# Patient Record
Sex: Male | Born: 1960 | Race: White | Hispanic: No | Marital: Married | Smoking: Current every day smoker
Health system: Southern US, Community
[De-identification: ages and names within clinical notes are randomized; demographics above are authoritative.]

## PROBLEM LIST (undated history)

## (undated) DIAGNOSIS — N181 Chronic kidney disease, stage 1: Secondary | ICD-10-CM

## (undated) DIAGNOSIS — M5020 Other cervical disc displacement, unspecified cervical region: Secondary | ICD-10-CM

## (undated) DIAGNOSIS — I10 Essential (primary) hypertension: Secondary | ICD-10-CM

## (undated) DIAGNOSIS — K219 Gastro-esophageal reflux disease without esophagitis: Secondary | ICD-10-CM

## (undated) DIAGNOSIS — F17200 Nicotine dependence, unspecified, uncomplicated: Secondary | ICD-10-CM

## (undated) DIAGNOSIS — R1310 Dysphagia, unspecified: Secondary | ICD-10-CM

## (undated) DIAGNOSIS — R3911 Hesitancy of micturition: Secondary | ICD-10-CM

## (undated) DIAGNOSIS — M5126 Other intervertebral disc displacement, lumbar region: Secondary | ICD-10-CM

## (undated) DIAGNOSIS — I129 Hypertensive chronic kidney disease with stage 1 through stage 4 chronic kidney disease, or unspecified chronic kidney disease: Secondary | ICD-10-CM

## (undated) HISTORY — DX: Hesitancy of micturition: R39.11

## (undated) HISTORY — DX: Other intervertebral disc displacement, lumbar region: M51.26

## (undated) HISTORY — DX: Dysphagia, unspecified: R13.10

## (undated) HISTORY — DX: Other cervical disc displacement, unspecified cervical region: M50.20

## (undated) HISTORY — PX: HERNIA REPAIR: SHX51

## (undated) HISTORY — DX: Essential (primary) hypertension: I10

## (undated) HISTORY — DX: Nicotine dependence, unspecified, uncomplicated: F17.200

## (undated) HISTORY — DX: Hypertensive chronic kidney disease with stage 1 through stage 4 chronic kidney disease, or unspecified chronic kidney disease: I12.9

## (undated) HISTORY — DX: Chronic kidney disease, stage 1: N18.1

---

## 2008-04-26 ENCOUNTER — Ambulatory Visit: Payer: Self-pay | Admitting: General Surgery

## 2014-05-18 ENCOUNTER — Ambulatory Visit
Admit: 2014-05-18 | Disposition: A | Discharge status: Home / Self Care | Payer: Self-pay | Attending: Family Medicine | Admitting: Family Medicine

## 2014-05-18 DIAGNOSIS — R1310 Dysphagia, unspecified: Secondary | ICD-10-CM | POA: Insufficient documentation

## 2014-05-18 DIAGNOSIS — F17200 Nicotine dependence, unspecified, uncomplicated: Secondary | ICD-10-CM | POA: Insufficient documentation

## 2014-05-18 DIAGNOSIS — I10 Essential (primary) hypertension: Secondary | ICD-10-CM | POA: Insufficient documentation

## 2014-05-18 DIAGNOSIS — R3911 Hesitancy of micturition: Secondary | ICD-10-CM | POA: Insufficient documentation

## 2014-05-18 DIAGNOSIS — I129 Hypertensive chronic kidney disease with stage 1 through stage 4 chronic kidney disease, or unspecified chronic kidney disease: Secondary | ICD-10-CM | POA: Insufficient documentation

## 2014-05-18 DIAGNOSIS — N181 Chronic kidney disease, stage 1: Secondary | ICD-10-CM | POA: Insufficient documentation

## 2014-12-25 ENCOUNTER — Other Ambulatory Visit: Payer: Self-pay | Admitting: Family Medicine

## 2014-12-25 ENCOUNTER — Ambulatory Visit (INDEPENDENT_AMBULATORY_CARE_PROVIDER_SITE_OTHER): Payer: BC Managed Care – PPO | Admitting: Family Medicine

## 2014-12-25 ENCOUNTER — Encounter: Payer: Self-pay | Admitting: Family Medicine

## 2014-12-25 VITALS — BP 149/93 | HR 78 | Temp 97.6°F | Ht 69.0 in | Wt 158.0 lb

## 2014-12-25 DIAGNOSIS — W57XXXA Bitten or stung by nonvenomous insect and other nonvenomous arthropods, initial encounter: Secondary | ICD-10-CM

## 2014-12-25 DIAGNOSIS — K219 Gastro-esophageal reflux disease without esophagitis: Secondary | ICD-10-CM | POA: Diagnosis not present

## 2014-12-25 DIAGNOSIS — I129 Hypertensive chronic kidney disease with stage 1 through stage 4 chronic kidney disease, or unspecified chronic kidney disease: Secondary | ICD-10-CM | POA: Diagnosis not present

## 2014-12-25 DIAGNOSIS — R0981 Nasal congestion: Secondary | ICD-10-CM

## 2014-12-25 DIAGNOSIS — Z23 Encounter for immunization: Secondary | ICD-10-CM | POA: Diagnosis not present

## 2014-12-25 DIAGNOSIS — Z113 Encounter for screening for infections with a predominantly sexual mode of transmission: Secondary | ICD-10-CM

## 2014-12-25 DIAGNOSIS — T148 Other injury of unspecified body region: Secondary | ICD-10-CM | POA: Diagnosis not present

## 2014-12-25 MED ORDER — DEXLANSOPRAZOLE 30 MG PO CPDR: 30.0000 mg | DELAYED_RELEASE_CAPSULE | Freq: Every day | ORAL | Status: DC

## 2014-12-25 MED ORDER — PREDNISONE 50 MG PO TABS: 50.0000 mg | ORAL_TABLET | Freq: Every day | ORAL | Status: DC

## 2014-12-25 MED ORDER — LISINOPRIL 10 MG PO TABS: 10.0000 mg | ORAL_TABLET | Freq: Every day | ORAL | Status: DC

## 2014-12-25 NOTE — Patient Instructions (Addendum)
Food Choices for Gastroesophageal Reflux Disease, Adult When you have gastroesophageal reflux disease (GERD), the foods you eat and your eating habits are very important. Choosing the right foods can help ease the discomfort of GERD. WHAT GENERAL GUIDELINES DO I NEED TO FOLLOW?  Choose fruits, vegetables, whole grains, low-fat dairy products, and low-fat meat, fish, and poultry.  Limit fats such as oils, salad dressings, butter, nuts, and avocado.  Keep a food diary to identify foods that cause symptoms.  Avoid foods that cause reflux. These may be different for different people.  Eat frequent small meals instead of three large meals each day.  Eat your meals slowly, in a relaxed setting.  Limit fried foods.  Cook foods using methods other than frying.  Avoid drinking alcohol.  Avoid drinking large amounts of liquids with your meals.  Avoid bending over or lying down until 2-3 hours after eating. WHAT FOODS ARE NOT RECOMMENDED? The following are some foods and drinks that may worsen your symptoms: Vegetables Tomatoes. Tomato juice. Tomato and spaghetti sauce. Chili peppers. Onion and garlic. Horseradish. Fruits Oranges, grapefruit, and lemon (fruit and juice). Meats High-fat meats, fish, and poultry. This includes hot dogs, ribs, ham, sausage, salami, and bacon. Dairy Whole milk and chocolate milk. Sour cream. Cream. Butter. Ice cream. Cream cheese.  Beverages Coffee and tea, with or without caffeine. Carbonated beverages or energy drinks. Condiments Hot sauce. Barbecue sauce.  Sweets/Desserts Chocolate and cocoa. Donuts. Peppermint and spearmint. Fats and Oils High-fat foods, including [place] fries and potato chips. Other Vinegar. Strong spices, such as black pepper, white pepper, red pepper, cayenne, curry powder, cloves, ginger, and chili powder. The items listed above may not be a complete list of foods and beverages to avoid. Contact your dietitian for more  information.   This information is not intended to replace advice given to you by your health care provider. Make sure you discuss any questions you have with your health care provider.   Document Released: 01/12/2005 Document Revised: 02/02/2014 Document Reviewed: 11/16/2012 Elsevier Interactive Patient Education 2016 [employer]. Smoking Cessation, Tips for Success If you are ready to quit smoking, congratulations! You have chosen to help yourself be healthier. Cigarettes bring nicotine, tar, carbon monoxide, and other irritants into your body. Your lungs, heart, and blood vessels will be able to work better without these poisons. There are many different ways to quit smoking. Nicotine gum, nicotine patches, a nicotine inhaler, or nicotine nasal spray can help with physical craving. Hypnosis, support groups, and medicines help break the habit of smoking. WHAT THINGS CAN I DO TO MAKE QUITTING EASIER?  Here are some tips to help you quit for good:  Pick a date when you will quit smoking completely. Tell all of your friends and family about your plan to quit on that date.  Do not try to slowly cut down on the number of cigarettes you are smoking. Pick a quit date and quit smoking completely starting on that day.  Throw away all cigarettes.   Clean and remove all ashtrays from your home, work, and car.  On a card, write down your reasons for quitting. Carry the card with you and read it when you get the urge to smoke.  Cleanse your body of nicotine. Drink enough water and fluids to keep your urine clear or pale yellow. Do this after quitting to flush the nicotine from your body.  Learn to predict your moods. Do not let a bad situation be your excuse to have  a cigarette. Some situations in your life might tempt you into wanting a cigarette.  Never have "just one" cigarette. It leads to wanting another and another. Remind yourself of your decision to quit.  Change habits associated with  smoking. If you smoked while driving or when feeling stressed, try other activities to replace smoking. Stand up when drinking your coffee. Brush your teeth after eating. Sit in a different chair when you read the paper. Avoid alcohol while trying to quit, and try to drink fewer caffeinated beverages. Alcohol and caffeine may urge you to smoke.  Avoid foods and drinks that can trigger a desire to smoke, such as sugary or spicy foods and alcohol.  Ask people who smoke not to smoke around you.  Have something planned to do right after eating or having a cup of coffee. For example, plan to take a walk or exercise.  Try a relaxation exercise to calm you down and decrease your stress. Remember, you may be tense and nervous for the first 2 weeks after you quit, but this will pass.  Find new activities to keep your hands busy. Play with a pen, coin, or rubber band. Doodle or draw things on paper.  Brush your teeth right after eating. This will help cut down on the craving for the taste of tobacco after meals. You can also try mouthwash.   Use oral substitutes in place of cigarettes. Try using lemon drops, carrots, cinnamon sticks, or chewing gum. Keep them handy so they are available when you have the urge to smoke.  When you have the urge to smoke, try deep breathing.  Designate your home as a nonsmoking area.  If you are a heavy smoker, ask your health care provider about a prescription for nicotine chewing gum. It can ease your withdrawal from nicotine.  Reward yourself. Set aside the cigarette money you save and buy yourself something nice.  Look for support from others. Join a support group or smoking cessation program. Ask someone at home or at work to help you with your plan to quit smoking.  Always ask yourself, "Do I need this cigarette or is this just a reflex?" Tell yourself, "Today, I choose not to smoke," or "I do not want to smoke." You are reminding yourself of your decision to  quit.  Do not replace cigarette smoking with electronic cigarettes (commonly called e-cigarettes). The safety of e-cigarettes is unknown, and some may contain harmful chemicals.  If you relapse, do not give up! Plan ahead and think about what you will do the next time you get the urge to smoke. HOW WILL I FEEL WHEN I QUIT SMOKING? You may have symptoms of withdrawal because your body is used to nicotine (the addictive substance in cigarettes). You may crave cigarettes, be irritable, feel very hungry, cough often, get headaches, or have difficulty concentrating. The withdrawal symptoms are only temporary. They are strongest when you first quit but will go away within 10-14 days. When withdrawal symptoms occur, stay in control. Think about your reasons for quitting. Remind yourself that these are signs that your body is healing and getting used to being without cigarettes. Remember that withdrawal symptoms are easier to treat than the major diseases that smoking can cause.  Even after the withdrawal is over, expect periodic urges to smoke. However, these cravings are generally short lived and will go away whether you smoke or not. Do not smoke! WHAT RESOURCES ARE AVAILABLE TO HELP ME QUIT SMOKING? Your health care  provider can direct you to community resources or hospitals for support, which may include:  Group support.  Education.  Hypnosis.  Therapy.   This information is not intended to replace advice given to you by your health care provider. Make sure you discuss any questions you have with your health care provider.   Document Released: 10/11/2003 Document Revised: 02/02/2014 Document Reviewed: 06/30/2012 Elsevier Interactive Patient Education [employer].

## 2014-12-25 NOTE — Assessment & Plan Note (Signed)
In exacerbation. Will start dexilant and check in in 1 month. If still really bad, will likely need EGD. Information about GERD given today.

## 2014-12-25 NOTE — Progress Notes (Signed)
BP 149/93 mmHg  Pulse 78  Temp(Src) 97.6 F (36.4 C)  Ht 5\' 9"  (1.753 m)  Wt 158 lb (71.668 kg)  BMI 23.32 kg/m2  SpO2 99%   Subjective:    Patient ID: Daniel Mcneil, male    DOB: [birth year], 54 y.o.   MRN: [id number]  HPI: Daniel Mcneil is a 54 y.o. male  Chief Complaint  Patient presents with  . Hypertension   HYPERTENSION- ran out of medicine the day before yesterday Hypertension status: uncontrolled  Satisfied with current treatment? 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, would like to be checked, no rashes, has been tired Duration- has been sick since August, thinks that he has a sinus infection Worst symptom: Congestion, headache Fever: no Cough: yes Shortness of breath: no Wheezing: no Chest pain: yes Chest tightness: no Chest congestion: no Nasal congestion: yes Runny nose: no Post nasal drip: yes Sneezing: no Sore throat: yes Swollen glands: no Sinus pressure: no Headache: yes Face pain: no Toothache: yes Ear pain: no  Ear pressure: no  Eyes red/itching:no Eye drainage/crusting: no  Vomiting: no Rash: no Fatigue: yes Sick contacts: no Strep contacts: no  Context: stable Recurrent sinusitis: no Relief with OTC cold/cough medications: no  Treatments attempted: cold/sinus, mucinex, anti-histamine and pseudoephedrine   HEARTBURN Duration:  X 1 month Onset: sudden Nature: burning in his chest Location: in the chest Radiation: no Episode duration: week Heartburn frequency: daily Alleviatiating factors: prilosec Aggravating factors: food Status: uncontrolled  Treatments attempted:  pepto bismol, tums, priolsec Antacid use frequency: daily Dysphagia: no Odynophagia:  no Nausea: no Vomiting: no Hematemesis: no Blood in stool: no Constitutional: no   Relevant past medical, surgical, family and social history reviewed and updated as indicated. Interim medical history since our last visit reviewed. Allergies and medications reviewed and updated.  Review of Systems  Constitutional: Negative.   HENT: Positive for congestion, hearing loss, postnasal drip, rhinorrhea, sinus pressure and voice change. Negative for dental problem, drooling, ear discharge, ear pain, facial swelling, mouth sores, nosebleeds, sneezing, sore throat, tinnitus and trouble swallowing.   Respiratory: Negative.   Cardiovascular: Negative.   Musculoskeletal: Positive for back pain and arthralgias. Negative for myalgias, joint swelling, gait problem, neck pain and neck stiffness.  Skin: Negative.   Psychiatric/Behavioral: Negative.     Per HPI unless specifically indicated above     Objective:    BP 149/93 mmHg  Pulse 78  Temp(Src) 97.6 F (36.4 C)  Ht 5\' 9"  (1.753 m)  Wt 158 lb (71.668 kg)  BMI 23.32 kg/m2  SpO2 99%  Wt Readings from Last 3 Encounters:  12/25/14 158 lb (71.668 kg)  05/18/14 162 lb (73.483 kg)    Physical Exam  Constitutional: He is oriented to person, place, and time. He appears well-developed and well-nourished. No distress.  HENT:  Head: Normocephalic and atraumatic.  Right Ear: Hearing and external ear normal.  Left Ear: Hearing and external ear normal.  Nose: Mucosal edema and rhinorrhea present. No nose lacerations, sinus tenderness, nasal deformity, septal deviation or nasal septal hematoma. No epistaxis.  No foreign bodies. Right sinus exhibits no maxillary sinus tenderness and no frontal sinus tenderness. Left sinus exhibits no maxillary  sinus tenderness and no frontal sinus tenderness.  Mouth/Throat: Oropharynx is clear and moist. No oropharyngeal exudate.   Eyes: Conjunctivae, EOM and lids are normal. Pupils are equal, round, and reactive to light. Right eye exhibits no discharge. Left eye exhibits no discharge. No scleral icterus.  Neck: Normal range of motion. Neck supple. No JVD present. No tracheal deviation present. No thyromegaly present.  Cardiovascular: Normal rate, regular rhythm, normal heart sounds and intact distal pulses.  Exam reveals no gallop and no friction rub.   No murmur heard. Pulmonary/Chest: Effort normal. No stridor. No respiratory distress. He has no wheezes. He has no rales. He exhibits no tenderness.  Abdominal: Soft. Bowel sounds are normal. He exhibits no distension and no mass. There is no tenderness. There is no rebound and no guarding.  Musculoskeletal: Normal range of motion.  Lymphadenopathy:    He has no cervical adenopathy.  Neurological: He is alert and oriented to person, place, and time.  Skin: Skin is warm, dry and intact. No rash noted. He is not diaphoretic. No erythema. No pallor.  Psychiatric: He has a normal mood and affect. His speech is normal and behavior is normal. Judgment and thought content normal. Cognition and memory are normal.  Nursing note and vitals reviewed.   No results found for this or any previous visit.    Assessment & Plan:   Problem List Items Addressed This Visit      Digestive   GERD (gastroesophageal reflux disease)    In exacerbation. Will start dexilant and check in in 1 month. If still really bad, will likely need EGD. Information about GERD given today.       Relevant Medications   Dexlansoprazole 30 MG capsule     Genitourinary   Hypertensive nephropathy    Will recheck BMP today. Microalbumin done in March. Continue current regimen. Continue to monitor. Recheck BP at follow up in 1 month.        Other Visit Diagnoses    Immunization due    -  Primary    Flu shot given today.     Relevant Orders    Flu Vaccine QUAD 36+ mos PF IM (Fluarix & Fluzone Quad PF)  (Completed)    Tick bite        Will screen for tick bourne diseases given arthralgias and fatigue and known tick bites. Await results.     Relevant Orders    Rocky mtn spotted fvr abs pnl(IgG+IgM)    Lyme Ab/Western Blot Reflex    Babesia microti Antibody Panel    Ehrlichia Antibody Panel    Routine screening for STI (sexually transmitted infection)        Will check HIV and Hep C. Await results.     Relevant Orders    Hepatitis C Antibody    HIV antibody    Nasal congestion        Will treat with prednisone x5 days followed by nasal saline. Call if not getting better or getting worse.         Follow up plan: Return in about 4 weeks (around 01/22/2015) for Follow up GERD and congestion.

## 2014-12-25 NOTE — Assessment & Plan Note (Signed)
Will recheck BMP today. Microalbumin done in March. Continue current regimen. Continue to monitor. Recheck BP at follow up in 1 month.

## 2014-12-26 ENCOUNTER — Encounter: Payer: Self-pay | Admitting: Family Medicine

## 2014-12-26 LAB — LYME AB/WESTERN BLOT REFLEX
LYME DISEASE AB, QUANT, IGM: <0.8 index (ref 0.00–0.79)
Lyme IgG/IgM Ab: <0.91 {ISR} (ref 0.00–0.90)

## 2014-12-26 LAB — BASIC METABOLIC PANEL
BUN/Creatinine Ratio: 21 — ABNORMAL HIGH (ref 9–20)
BUN: 18 mg/dL (ref 6–24)
CO2: 22 mmol/L (ref 18–29)
Calcium: 9.4 mg/dL (ref 8.7–10.2)
Chloride: 104 mmol/L (ref 97–106)
Creatinine, Ser: 0.85 mg/dL (ref 0.76–1.27)
GFR, EST AFRICAN AMERICAN: 117 mL/min/{1.73_m2} (ref >59)
GFR, EST NON AFRICAN AMERICAN: 101 mL/min/{1.73_m2} (ref >59)
Glucose: 60 mg/dL — ABNORMAL LOW (ref 65–99)
POTASSIUM: 4.3 mmol/L (ref 3.5–5.2)
SODIUM: 142 mmol/L (ref 136–144)

## 2014-12-26 LAB — EHRLICHIA ANTIBODY PANEL
E. Chaffeensis (HME) IgM Titer: NEGATIVE
E.Chaffeensis (HME) IgG: NEGATIVE
HGE IGM TITER: NEGATIVE
HGE IgG Titer: NEGATIVE

## 2014-12-26 LAB — HEPATITIS C ANTIBODY

## 2014-12-26 LAB — HIV ANTIBODY (ROUTINE TESTING W REFLEX): HIV Screen 4th Generation wRfx: NONREACTIVE

## 2014-12-28 ENCOUNTER — Telehealth: Payer: Self-pay | Admitting: Family Medicine

## 2014-12-28 MED ORDER — DOXYCYCLINE HYCLATE 100 MG PO TABS: 100.0000 mg | ORAL_TABLET | Freq: Two times a day (BID) | ORAL | Status: DC

## 2014-12-28 NOTE — Telephone Encounter (Signed)
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 I'm sending through an antibiotic for him. He may feel worse on it for the first 24-48 hours, but then he should feel better. He should absolutely take it with food. Everything else looks nice and normal.

## 2014-12-28 NOTE — Telephone Encounter (Signed)
Called and LVM for patient to return my call. °

## 2014-12-29 LAB — ROCKY MTN SPOTTED FVR ABS PNL(IGG+IGM): RMSF IgG: UNDETERMINED

## 2014-12-29 LAB — BABESIA MICROTI ANTIBODY PANEL: Babesia microti IgG: 1:40 {titer} — ABNORMAL HIGH

## 2014-12-29 LAB — RMSF, IGG, IFA

## 2014-12-31 NOTE — Telephone Encounter (Signed)
Pt would like a call back

## 2014-12-31 NOTE — Telephone Encounter (Signed)
Returned patients call, left a voicemail for patient to return my call.

## 2015-01-22 ENCOUNTER — Ambulatory Visit (INDEPENDENT_AMBULATORY_CARE_PROVIDER_SITE_OTHER): Payer: BC Managed Care – PPO | Admitting: Family Medicine

## 2015-01-22 ENCOUNTER — Encounter: Payer: Self-pay | Admitting: Family Medicine

## 2015-01-22 VITALS — BP 128/86 | HR 73 | Temp 97.9°F | Ht 68.7 in | Wt 160.0 lb

## 2015-01-22 DIAGNOSIS — R1310 Dysphagia, unspecified: Secondary | ICD-10-CM

## 2015-01-22 DIAGNOSIS — R0981 Nasal congestion: Secondary | ICD-10-CM

## 2015-01-22 DIAGNOSIS — K219 Gastro-esophageal reflux disease without esophagitis: Secondary | ICD-10-CM

## 2015-01-22 MED ORDER — OMEPRAZOLE 40 MG PO CPDR: 40.0000 mg | DELAYED_RELEASE_CAPSULE | Freq: Every day | ORAL | Status: DC

## 2015-01-22 NOTE — Progress Notes (Signed)
BP 128/86 mmHg  Pulse 73  Temp(Src) 97.9 F (36.6 C)  Ht 5' 8.7" (1.745 m)  Wt 160 lb (72.576 kg)  BMI 23.83 kg/m2  SpO2 98%   Subjective:    Patient ID: Daniel Mcneil, male    DOB: [birth year], 54 y.o.   MRN: [id number]  HPI: Daniel Mcneil is a 54 y.o. male  Chief Complaint  Patient presents with  . Gastroesophageal Reflux    Patient states that his insurance would not cover the prescription, so he got Prilosec OTC, he states that it is not helping, but he is not taking it daily  . Nasal Congestion   GERD GERD control status: better occasionally Satisfied with current treatment? no Heartburn frequency: daily  Medication side effects: no  Medication compliance: stable Previous GERD medications: omeprazole 20mg  Antacid use frequency:  daily Duration: 1 minute Nature: burning, feeling like things are getting stuck Location: in the chest Heartburn duration: 1 minute Alleviatiating factors:  Omeprazole occasionally, not always taking it.  Aggravating factors: eating Dysphagia: yes Odynophagia:  no Hematemesis: no Blood in stool: no EGD: no  Had a little congestion on Christmas night, but all the swelling has gone away. 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 Going to see his periodontitis to watch the biopsy. He did very well with the prednisone, continues with the soreness in his neck and jaw.   Relevant past medical, surgical, family and social history reviewed and updated as indicated. Interim medical history since our last visit reviewed. Allergies and medications reviewed and updated.  Review of Systems  Constitutional: Negative.   HENT: Positive for congestion and dental problem. Negative for drooling, ear discharge, ear pain, facial swelling, hearing loss, mouth sores, nosebleeds, postnasal drip, rhinorrhea, sinus pressure, sneezing, sore throat, tinnitus, trouble swallowing and  voice change.   Respiratory: Negative.   Cardiovascular: Negative.   Gastrointestinal: Negative for nausea, vomiting, abdominal pain, diarrhea, constipation, blood in stool, abdominal distention, anal bleeding and rectal pain.  Psychiatric/Behavioral: Negative.     Per HPI unless specifically indicated above     Objective:    BP 128/86 mmHg  Pulse 73  Temp(Src) 97.9 F (36.6 C)  Ht 5' 8.7" (1.745 m)  Wt 160 lb (72.576 kg)  BMI 23.83 kg/m2  SpO2 98%  Wt Readings from Last 3 Encounters:  01/22/15 160 lb (72.576 kg)  12/25/14 158 lb (71.668 kg)  05/18/14 162 lb (73.483 kg)    Physical Exam  Constitutional: He is oriented to person, place, and time. He appears well-developed and well-nourished. No distress.  HENT:  Head: Normocephalic and atraumatic.  Right Ear: Hearing normal.  Left Ear: Hearing normal.  Nose: Nose normal.  Eyes: Conjunctivae and lids are normal. Right eye exhibits no discharge. Left eye exhibits no discharge. No scleral icterus.  Cardiovascular: Normal rate, regular rhythm, normal heart sounds and intact distal pulses.  Exam reveals no gallop and no friction rub.   No murmur heard. Pulmonary/Chest: Effort normal and breath sounds normal. No respiratory distress. He has no wheezes. He has no rales. He exhibits no tenderness.  Abdominal: Soft. Bowel sounds are normal. He exhibits no distension and no mass. There is no tenderness. There is no rebound and no guarding.  Musculoskeletal: Normal range of motion.  Neurological: He is alert and oriented to person, place, and time.  Skin: Skin is warm, dry and intact. No rash noted. No erythema. No  pallor.  Psychiatric: He has a normal mood and affect. His speech is normal and behavior is normal. Judgment and thought content normal. Cognition and memory are normal.  Nursing note and vitals reviewed.   Results for orders placed or performed in visit on 0000000  Basic metabolic panel  Result Value Ref Range    Glucose 60 (L) 65 - 99 mg/dL   BUN 18 6 - 24 mg/dL   Creatinine, Ser 0.85 0.76 - 1.27 mg/dL   GFR calc non Af Amer 101 >59 mL/min/1.73   GFR calc Af Amer 117 >59 mL/min/1.73   BUN/Creatinine Ratio 21 (H) 9 - 20   Sodium 142 136 - 144 mmol/L   Potassium 4.3 3.5 - 5.2 mmol/L   Chloride 104 97 - 106 mmol/L   CO2 22 18 - 29 mmol/L   Calcium 9.4 8.7 - 10.2 mg/dL  Rocky mtn spotted fvr abs pnl(IgG+IgM)  Result Value Ref Range   RMSF IgG Equivocal Negative  Lyme Ab/Western Blot Reflex  Result Value Ref Range   Lyme IgG/IgM Ab <0.91 0.00 - 0.90 ISR   LYME DISEASE AB, QUANT, IGM <0.80 0.00 - 0.79 index  Babesia microti Antibody Panel  Result Value Ref Range   Babesia microti IgG 1:40 (H) [id number]  Ehrlichia Antibody Panel  Result Value Ref Range   E.Chaffeensis (HME) IgG Negative Neg:<1:64   E. Chaffeensis ([facility]ME) IgM Titer Negative Neg:<1:20   HGE IgG Titer Negative Neg:<1:64   HGE IgM Titer Negative Neg:<1:20  Hepatitis C Antibody  Result Value Ref Range   Hep C Virus Ab CANCELED s/co ratio  HIV antibody  Result Value Ref Range   HIV Screen 4th Generation wRfx CANCELED   HIV antibody  Result Value Ref Range   HIV Screen 4th Generation wRfx Non Reactive Non Reactive  Hepatitis C antibody  Result Value Ref Range   Hep C Virus Ab <0.1 0.0 - 0.9 s/co ratio  RMSF, IgG, IFA  Result Value Ref Range   RMSF, IGG, IFA 1:128 (H) Neg <1:64      Assessment & Plan:   Problem List Items Addressed This Visit      Digestive   Dysphagia    See above.       GERD (gastroesophageal reflux disease) - Primary    Will start omeprazole 40mg  daily for 2 weeks, if still having trouble swallowing at that point, will refer to GI for EGD. Continue to monitor closely.       Relevant Medications   omeprazole (PRILOSEC OTC) 20 MG tablet   omeprazole (PRILOSEC) 40 MG capsule    Other Visit Diagnoses    Nasal congestion        Improved. Still having some issues with his mouth. Follow up with  periodontist as needed.         Follow up plan: Return if symptoms worsen or fail to improve.

## 2015-01-22 NOTE — Assessment & Plan Note (Signed)
Will start omeprazole 40mg  daily for 2 weeks, if still having trouble swallowing at that point, will refer to GI for EGD. Continue to monitor closely.

## 2015-01-22 NOTE — Assessment & Plan Note (Signed)
See above

## 2015-02-06 ENCOUNTER — Telehealth: Payer: Self-pay | Admitting: Family Medicine

## 2015-02-06 NOTE — Telephone Encounter (Signed)
-----   Message from Valerie Roys, DO sent at 01/22/2015 10:01 AM EST ----- Check in on dyphagia, if still acting up, GI referral

## 2015-02-06 NOTE — Telephone Encounter (Signed)
Called to check in on Daniel Mcneil's dysphagia. LMOM. He is to call back.

## 2015-02-06 NOTE — Telephone Encounter (Signed)
Called and LMOM to check in on his dysphagia.

## 2015-02-07 NOTE — Telephone Encounter (Signed)
Notes that his swallowing is much better, no problem swallowing. Does not need to see GI at this time. Continue to monitor. Call if not getting better or getting worse again.

## 2015-06-18 ENCOUNTER — Ambulatory Visit: Payer: BC Managed Care – PPO | Admitting: Family Medicine

## 2015-06-20 ENCOUNTER — Encounter: Payer: Self-pay | Admitting: Family Medicine

## 2015-06-20 ENCOUNTER — Ambulatory Visit (INDEPENDENT_AMBULATORY_CARE_PROVIDER_SITE_OTHER): Payer: BC Managed Care – PPO | Admitting: Family Medicine

## 2015-06-20 VITALS — BP 144/97 | HR 78 | Temp 98.7°F | Ht 69.7 in | Wt 159.8 lb

## 2015-06-20 DIAGNOSIS — I129 Hypertensive chronic kidney disease with stage 1 through stage 4 chronic kidney disease, or unspecified chronic kidney disease: Secondary | ICD-10-CM

## 2015-06-20 DIAGNOSIS — Z13220 Encounter for screening for lipoid disorders: Secondary | ICD-10-CM | POA: Diagnosis not present

## 2015-06-20 DIAGNOSIS — Z0000 Encounter for general adult medical examination without abnormal findings: Secondary | ICD-10-CM | POA: Diagnosis not present

## 2015-06-20 DIAGNOSIS — Z125 Encounter for screening for malignant neoplasm of prostate: Secondary | ICD-10-CM | POA: Diagnosis not present

## 2015-06-20 DIAGNOSIS — W57XXXA Bitten or stung by nonvenomous insect and other nonvenomous arthropods, initial encounter: Secondary | ICD-10-CM | POA: Diagnosis not present

## 2015-06-20 DIAGNOSIS — T148 Other injury of unspecified body region: Secondary | ICD-10-CM | POA: Diagnosis not present

## 2015-06-20 LAB — MICROALBUMIN, URINE WAIVED
CREATININE, URINE WAIVED: 100 mg/dL (ref 10–300)
MICROALB, UR WAIVED: 80 mg/L — AB (ref 0–19)

## 2015-06-20 LAB — MICROSCOPIC EXAMINATION: WBC UA: NONE SEEN /HPF (ref 0–?)

## 2015-06-20 LAB — UA/M W/RFLX CULTURE, ROUTINE
BILIRUBIN UA: NEGATIVE
GLUCOSE, UA: NEGATIVE
KETONES UA: NEGATIVE
LEUKOCYTES UA: NEGATIVE
Nitrite, UA: NEGATIVE
PH UA: 5 (ref 5.0–7.5)
SPEC GRAV UA: 1.02 (ref 1.005–1.030)
UUROB: 0.2 mg/dL (ref 0.2–1.0)

## 2015-06-20 MED ORDER — LISINOPRIL 10 MG PO TABS: 10.0000 mg | ORAL_TABLET | Freq: Every day | ORAL | Status: DC

## 2015-06-20 MED ORDER — DOXYCYCLINE HYCLATE 100 MG PO TABS: 100.0000 mg | ORAL_TABLET | Freq: Two times a day (BID) | ORAL | Status: DC

## 2015-06-20 NOTE — Assessment & Plan Note (Signed)
Refill of his medicine given today. Continue to monitor. Call with any concerns. Labs checked today.

## 2015-06-20 NOTE — Progress Notes (Signed)
BP 144/97 mmHg  Pulse 78  Temp(Src) 98.7 F (37.1 C)  Ht 5' 9.7" (1.77 m)  Wt 159 lb 12.8 oz (72.485 kg)  BMI 23.14 kg/m2  SpO2 98%   Subjective:    Patient ID: Daniel Mcneil, male    DOB: 02-Apr-1960, 55 y.o.   MRN: [id number]  HPI: [name] is a 55 y.o. male  Chief Complaint  Patient presents with  . Insect Bite    pt states he was bitten by a tick on his left hip on Monday, states he has been bitten 8 to 10 times this year already  . Medication Refill    pt states he needs a refill on lisinopril, states he has an appointment for his BP next week but needs medication to get to that appointment    Got bit by numerous ticks since the spring. Had one last week on his hip that left a mark with a large red ring around it that has now resolved. He had a bad headache, and has been feeling achy and not entirely like himself. Concerned about tick diseases. No fevers. No chills. No other concerns at this time.   Relevant past medical, surgical, family and social history reviewed and updated as indicated. Interim medical history since our last visit reviewed. Allergies and medications reviewed and updated.  Review of Systems  Constitutional: Positive for fatigue. Negative for fever, chills, diaphoresis, activity change, appetite change and unexpected weight change.  Respiratory: Negative.   Cardiovascular: Negative.   Musculoskeletal: Positive for myalgias and arthralgias. Negative for back pain, joint swelling, gait problem, neck pain and neck stiffness.  Skin: Positive for wound. Negative for color change, pallor and rash.  Neurological: Positive for headaches.  Psychiatric/Behavioral: Negative.     Per HPI unless specifically indicated above     Objective:    BP 144/97 mmHg  Pulse 78  Temp(Src) 98.7 F (37.1 C)  Ht 5' 9.7" (1.77 m)  Wt 159 lb 12.8 oz (72.485 kg)  BMI 23.14 kg/m2  SpO2 98%  Wt Readings from Last 3 Encounters:  06/20/15 159 lb 12.8 oz (72.485 kg)   01/22/15 160 lb (72.576 kg)  12/25/14 158 lb (71.668 kg)    Physical Exam  Constitutional: He is oriented to person, place, and time. He appears well-developed and well-nourished. No distress.  HENT:  Head: Normocephalic and atraumatic.  Right Ear: Hearing normal.  Left Ear: Hearing normal.  Nose: Nose normal.  Eyes: Conjunctivae and lids are normal. Right eye exhibits no discharge. Left eye exhibits no discharge. No scleral icterus.  Cardiovascular: Normal rate, regular rhythm, normal heart sounds and intact distal pulses.  Exam reveals no gallop and no friction rub.   No murmur heard. Pulmonary/Chest: Effort normal and breath sounds normal. No respiratory distress. He has no wheezes. He has no rales. He exhibits no tenderness.  Musculoskeletal: Normal range of motion.  Neurological: He is alert and oriented to person, place, and time.  Skin: Skin is warm, dry and intact. No rash noted. No erythema. No pallor.  Scab on L hip where tick was attached  Psychiatric: He has a normal mood and affect. His speech is normal and behavior is normal. Judgment and thought content normal. Cognition and memory are normal.  Nursing note and vitals reviewed.   Results for orders placed or performed in visit on 0000000  Basic metabolic panel  Result Value Ref Range   Glucose 60 (L) 65 - 99 mg/dL   BUN 18 6 -  24 mg/dL   Creatinine, Ser 0.85 0.76 - 1.27 mg/dL   GFR calc non Af Amer 101 >59 mL/min/1.73   GFR calc Af Amer 117 >59 mL/min/1.73   BUN/Creatinine Ratio 21 (H) 9 - 20   Sodium 142 136 - 144 mmol/L   Potassium 4.3 3.5 - 5.2 mmol/L   Chloride 104 97 - 106 mmol/L   CO2 22 18 - 29 mmol/L   Calcium 9.4 8.7 - 10.2 mg/dL  Rocky mtn spotted fvr abs pnl(IgG+IgM)  Result Value Ref Range   RMSF IgG Equivocal Negative  Lyme Ab/Western Blot Reflex  Result Value Ref Range   Lyme IgG/IgM Ab <0.91 0.00 - 0.90 ISR   LYME DISEASE AB, QUANT, IGM <0.80 0.00 - 0.79 index  Babesia microti Antibody Panel   Result Value Ref Range   Babesia microti IgG 1:40 (H) 123456  Ehrlichia Antibody Panel  Result Value Ref Range   E.Chaffeensis (HME) IgG Negative Neg:<1:64   E. Chaffeensis (HME) IgM Titer Negative Neg:<1:20   HGE IgG Titer Negative Neg:<1:64   HGE IgM Titer Negative Neg:<1:20  Hepatitis C Antibody  Result Value Ref Range   Hep C Virus Ab CANCELED s/co ratio  HIV antibody  Result Value Ref Range   HIV Screen 4th Generation wRfx CANCELED   HIV antibody  Result Value Ref Range   HIV Screen 4th Generation wRfx Non Reactive Non Reactive  Hepatitis C antibody  Result Value Ref Range   Hep C Virus Ab <0.1 0.0 - 0.9 s/co ratio  RMSF, IgG, IFA  Result Value Ref Range   RMSF, IGG, IFA 1:128 (H) Neg <1:64      Assessment & Plan:   Problem List Items Addressed This Visit      Genitourinary   Hypertensive nephropathy    Refill of his medicine given today. Continue to monitor. Call with any concerns. Labs checked today.      Relevant Orders   Comprehensive metabolic panel   Microalbumin, Urine Waived    Other Visit Diagnoses    Tick bite    -  Primary    Numerous. Will check for tick diseases. Described erythema migricans. Will treat with 10 days of doxy empirically. Call with any concerns.     Relevant Orders    Lyme Ab/Western Blot Reflex    Rocky mtn spotted fvr abs pnl(IgG+IgM)    Ehrlichia Antibody Panel    Babesia microti Antibody Panel    Routine general medical examination at a health care facility        Being done next week- will draw labs today to save him a stick next week.     Relevant Orders    CBC with Differential/Platelet    Comprehensive metabolic panel    PSA    TSH    UA/M w/rflx Culture, Routine    Screening for cholesterol level        Being done next week- will draw labs today to save him a stick next week.     Relevant Orders    Lipid Panel w/o Chol/HDL Ratio    Screening for prostate cancer        Being done next week- will draw labs today  to save him a stick next week.     Relevant Orders    PSA        Follow up plan: Return in about 1 week (around 06/27/2015) for As scheduled for physical.

## 2015-06-26 ENCOUNTER — Encounter: Payer: Self-pay | Admitting: Family Medicine

## 2015-06-26 LAB — LIPID PANEL W/O CHOL/HDL RATIO
Cholesterol, Total: 202 mg/dL — ABNORMAL HIGH (ref 100–199)
HDL: 36 mg/dL — AB (ref >39)
LDL Calculated: 151 mg/dL — ABNORMAL HIGH (ref 0–99)
Triglycerides: 73 mg/dL (ref 0–149)
VLDL CHOLESTEROL CAL: 15 mg/dL (ref 5–40)

## 2015-06-26 LAB — CBC WITH DIFFERENTIAL/PLATELET
BASOS ABS: 0 10*3/uL (ref 0.0–0.2)
Basos: 0 %
EOS (ABSOLUTE): 0.4 10*3/uL (ref 0.0–0.4)
Eos: 3 %
Hematocrit: 49.6 % (ref 37.5–51.0)
Hemoglobin: 16.7 g/dL (ref 12.6–17.7)
IMMATURE GRANS (ABS): 0 10*3/uL (ref 0.0–0.1)
IMMATURE GRANULOCYTES: 0 %
LYMPHS: 12 %
Lymphocytes Absolute: 1.2 10*3/uL (ref 0.7–3.1)
MCH: 30.1 pg (ref 26.6–33.0)
MCHC: 33.7 g/dL (ref 31.5–35.7)
MCV: 89 fL (ref 79–97)
Monocytes Absolute: 1.1 10*3/uL — ABNORMAL HIGH (ref 0.1–0.9)
Monocytes: 10 %
NEUTROS PCT: 75 %
Neutrophils Absolute: 7.9 10*3/uL — ABNORMAL HIGH (ref 1.4–7.0)
PLATELETS: 235 10*3/uL (ref 150–379)
RBC: 5.55 x10E6/uL (ref 4.14–5.80)
RDW: 14.4 % (ref 12.3–15.4)
WBC: 10.6 10*3/uL (ref 3.4–10.8)

## 2015-06-26 LAB — COMPREHENSIVE METABOLIC PANEL
ALT: 31 IU/L (ref 0–44)
AST: 34 IU/L (ref 0–40)
Albumin/Globulin Ratio: 1.6 (ref 1.2–2.2)
Albumin: 4.4 g/dL (ref 3.5–5.5)
Alkaline Phosphatase: 84 IU/L (ref 39–117)
BUN/Creatinine Ratio: 24 — ABNORMAL HIGH (ref 9–20)
BUN: 21 mg/dL (ref 6–24)
CALCIUM: 9.3 mg/dL (ref 8.7–10.2)
CHLORIDE: 104 mmol/L (ref 96–106)
CO2: 20 mmol/L (ref 18–29)
Creatinine, Ser: 0.86 mg/dL (ref 0.76–1.27)
GFR calc non Af Amer: 100 mL/min/{1.73_m2} (ref >59)
GFR, EST AFRICAN AMERICAN: 115 mL/min/{1.73_m2} (ref >59)
GLUCOSE: 68 mg/dL (ref 65–99)
Globulin, Total: 2.7 g/dL (ref 1.5–4.5)
Potassium: 4.3 mmol/L (ref 3.5–5.2)
Sodium: 142 mmol/L (ref 134–144)
TOTAL PROTEIN: 7.1 g/dL (ref 6.0–8.5)

## 2015-06-26 LAB — EHRLICHIA ANTIBODY PANEL
E. Chaffeensis (HME) IgM Titer: NEGATIVE
E.Chaffeensis (HME) IgG: NEGATIVE
HGE IgG Titer: NEGATIVE
HGE IgM Titer: NEGATIVE

## 2015-06-26 LAB — BABESIA MICROTI ANTIBODY PANEL

## 2015-06-26 LAB — ROCKY MTN SPOTTED FVR ABS PNL(IGG+IGM)
RMSF IgG: NEGATIVE
RMSF IgM: 0.36 index (ref 0.00–0.89)

## 2015-06-26 LAB — LYME AB/WESTERN BLOT REFLEX

## 2015-06-26 LAB — PSA: Prostate Specific Ag, Serum: 0.4 ng/mL (ref 0.0–4.0)

## 2015-06-26 LAB — TSH: TSH: 1.37 u[IU]/mL (ref 0.450–4.500)

## 2015-07-08 ENCOUNTER — Ambulatory Visit (INDEPENDENT_AMBULATORY_CARE_PROVIDER_SITE_OTHER): Payer: BC Managed Care – PPO | Admitting: Family Medicine

## 2015-07-08 ENCOUNTER — Encounter: Payer: Self-pay | Admitting: Family Medicine

## 2015-07-08 VITALS — BP 135/85 | HR 65 | Temp 97.8°F | Ht 69.5 in | Wt 160.0 lb

## 2015-07-08 DIAGNOSIS — R3911 Hesitancy of micturition: Secondary | ICD-10-CM

## 2015-07-08 DIAGNOSIS — M79606 Pain in leg, unspecified: Secondary | ICD-10-CM

## 2015-07-08 DIAGNOSIS — F17200 Nicotine dependence, unspecified, uncomplicated: Secondary | ICD-10-CM

## 2015-07-08 DIAGNOSIS — I129 Hypertensive chronic kidney disease with stage 1 through stage 4 chronic kidney disease, or unspecified chronic kidney disease: Secondary | ICD-10-CM

## 2015-07-08 DIAGNOSIS — K219 Gastro-esophageal reflux disease without esophagitis: Secondary | ICD-10-CM

## 2015-07-08 DIAGNOSIS — Z1211 Encounter for screening for malignant neoplasm of colon: Secondary | ICD-10-CM | POA: Diagnosis not present

## 2015-07-08 DIAGNOSIS — Z0000 Encounter for general adult medical examination without abnormal findings: Secondary | ICD-10-CM | POA: Diagnosis not present

## 2015-07-08 MED ORDER — OMEPRAZOLE 20 MG PO CPDR: 20.0000 mg | DELAYED_RELEASE_CAPSULE | Freq: Every day | ORAL | Status: DC

## 2015-07-08 NOTE — Assessment & Plan Note (Signed)
Does not want medicine at this time. Continue to monitor.

## 2015-07-08 NOTE — Progress Notes (Signed)
BP 135/85 mmHg  Pulse 65  Temp(Src) 97.8 F (36.6 C)  Ht 5' 9.5" (1.765 m)  Wt 160 lb (72.576 kg)  BMI 23.30 kg/m2  SpO2 98%   Subjective:    Patient ID: Daniel Mcneil, male    DOB: February 07, 1960, 55 y.o.   MRN: [id number]  HPI: Daniel Mcneil is a 55 y.o. male presenting on 07/08/2015 for comprehensive medical examination. Current medical complaints include:none  He currently lives with: wife Interim Problems from his last visit: no  Depression Screen done today and results listed below:  Depression screen PHQ 2/9 07/08/2015  Decreased Interest 1  Down, Depressed, Hopeless 1  PHQ - 2 Score 2  Altered sleeping 1  Tired, decreased energy 1  Change in appetite 0  Feeling bad or failure about yourself  1  Trouble concentrating 0  Moving slowly or fidgety/restless 1  Suicidal thoughts 0  PHQ-9 Score 6    Past Medical History:  Past Medical History  Diagnosis Date  . Herniated disc, cervical   . Lumbar herniated disc   . Hypertension   . Dysphagia   . Tobacco use disorder   . Urinary hesitancy   . Hypertensive nephropathy   . Chronic kidney disease, stage I     Surgical History:  Past Surgical History  Procedure Laterality Date  . Hernia repair      Medications:  Current Outpatient Prescriptions on File Prior to Visit  Medication Sig  . lisinopril (PRINIVIL,ZESTRIL) 10 MG tablet Take 1 tablet (10 mg total) by mouth daily.   No current facility-administered medications on file prior to visit.    Allergies:  No Known Allergies  Social History:  Social History   Social History  . Marital Status: Married    Spouse Name: [name]  . Number of Children: [name]  . Years of Education: [name]   Occupational History  . Not on file.   Social History Main Topics  . Smoking status: Current Every Day Smoker -- 1.50 packs/day    Types: Cigarettes  . Smokeless tobacco: Never Used  . Alcohol Use: 0.0 oz/week    0 Standard drinks or equivalent per week     Comment: 3 days a week   . Drug Use: No  . Sexual Activity: Yes    Birth Control/ Protection: None   Other Topics Concern  . Not on file   Social History Narrative   History  Smoking status  . Current Every Day Smoker -- 1.50 packs/day  . Types: Cigarettes  Smokeless tobacco  . Never Used   History  Alcohol Use  . 0.0 oz/week  . 0 Standard drinks or equivalent per week    Comment: 3 days a week    Family History:  Family History  Problem Relation Age of Onset  . Stroke Mother   . Hypertension Mother   . Cancer Father   . Hypertension Father   . Diabetes Father   . Heart disease Father   . Cancer Maternal Grandfather   . Hypertension Sister     Past medical history, surgical history, medications, allergies, family history and social history reviewed with patient today and changes made to appropriate areas of the chart.   Review of Systems  Constitutional: Negative.   HENT: Positive for congestion. Negative for ear discharge, ear pain, hearing loss, nosebleeds, sore throat and tinnitus.   Eyes: Negative.   Respiratory: Positive for cough. Negative for hemoptysis, sputum production, shortness of breath, wheezing and stridor.   Cardiovascular:  Negative.   Gastrointestinal: Positive for heartburn (under good control with the omeprazole). Negative for nausea, vomiting, abdominal pain, diarrhea, constipation, blood in stool and melena.  Genitourinary: Negative.   Musculoskeletal: Positive for myalgias (back of his legs very sore) and back pain (muscle spasms in his back). Negative for joint pain, falls and neck pain.  Skin: Negative.        Spot under R eye getting slightly better  Neurological: Negative.  Negative for headaches.  Endo/Heme/Allergies: Positive for environmental allergies. Negative for polydipsia. Bruises/bleeds easily.  Psychiatric/Behavioral: Negative.     All other ROS negative except what is listed above and in the HPI.      Objective:    BP 135/85 mmHg  Pulse 65   Temp(Src) 97.8 F (36.6 C)  Ht 5' 9.5" (1.765 m)  Wt 160 lb (72.576 kg)  BMI 23.30 kg/m2  SpO2 98%  Wt Readings from Last 3 Encounters:  07/08/15 160 lb (72.576 kg)  06/20/15 159 lb 12.8 oz (72.485 kg)  01/22/15 160 lb (72.576 kg)    Physical Exam  Constitutional: He is oriented to person, place, and time. He appears well-developed and well-nourished. No distress.  HENT:  Head: Normocephalic and atraumatic.  Right Ear: Hearing, tympanic membrane, external ear and ear canal normal.  Left Ear: Hearing, tympanic membrane, external ear and ear canal normal.  Nose: Nose normal.  Mouth/Throat: Uvula is midline, oropharynx is clear and moist and mucous membranes are normal. No oropharyngeal exudate.  Eyes: Conjunctivae, EOM and lids are normal. Pupils are equal, round, and reactive to light. Right eye exhibits no discharge. Left eye exhibits no discharge. No scleral icterus.  Neck: Normal range of motion. Neck supple. No JVD present. No tracheal deviation present. No thyromegaly present.  Cardiovascular: Normal rate, regular rhythm, normal heart sounds and intact distal pulses.  Exam reveals no gallop and no friction rub.   No murmur heard. Pulmonary/Chest: Effort normal. No stridor. No respiratory distress.  Abdominal: Soft. Bowel sounds are normal. He exhibits no distension and no mass. There is no tenderness. There is no rebound and no guarding. Hernia confirmed negative in the right inguinal area and confirmed negative in the left inguinal area.  Genitourinary: Rectum normal, testes normal and penis normal. Prostate is enlarged. Prostate is not tender. Cremasteric reflex is present. Right testis shows no mass, no swelling and no tenderness. Right testis is descended. Cremasteric reflex is not absent on the right side. Left testis shows no mass, no swelling and no tenderness. Left testis is descended. Cremasteric reflex is not absent on the left side. Circumcised. No penile tenderness.   Musculoskeletal: Normal range of motion. He exhibits no edema or tenderness.  Lymphadenopathy:    He has no cervical adenopathy.  Neurological: He is alert and oriented to person, place, and time. He has normal reflexes. He displays normal reflexes. No cranial nerve deficit. He exhibits normal muscle tone. Coordination normal.  Skin: Skin is warm, dry and intact. No rash noted. He is not diaphoretic. No erythema. No pallor.  Psychiatric: He has a normal mood and affect. His speech is normal and behavior is normal. Judgment and thought content normal. Cognition and memory are normal.  Nursing note and vitals reviewed.   Results for orders placed or performed in visit on 06/20/15  Microscopic Examination  Result Value Ref Range   WBC, UA None seen 0 -  5 /hpf   RBC, UA 0-2 0 -  2 /hpf   Epithelial Cells (non  renal) 0-10 0 - 10 /hpf  Lyme Ab/Western Blot Reflex  Result Value Ref Range   Lyme IgG/IgM Ab <0.91 0.00 - 0.90 ISR   LYME DISEASE AB, QUANT, IGM <0.80 0.00 - 0.79 index  Rocky mtn spotted fvr abs pnl(IgG+IgM)  Result Value Ref Range   RMSF IgG Negative Negative   RMSF IgM 0.36 0.00 - 123456 index  Ehrlichia Antibody Panel  Result Value Ref Range   E.Chaffeensis (HME) IgG Negative Neg:<1:64   E. Chaffeensis (HME) IgM Titer Negative Neg:<1:20   HGE IgG Titer Negative Neg:<1:64   HGE IgM Titer Negative Neg:<1:20  Babesia microti Antibody Panel  Result Value Ref Range   Babesia microti IgM <1:10 Neg:<1:10   Babesia microti IgG <1:10 Neg:<1:10  CBC with Differential/Platelet  Result Value Ref Range   WBC 10.6 3.4 - 10.8 x10E3/uL   RBC 5.55 4.14 - 5.80 x10E6/uL   Hemoglobin 16.7 12.6 - 17.7 g/dL   Hematocrit 49.6 37.5 - 51.0 %   MCV 89 79 - 97 fL   MCH 30.1 26.6 - 33.0 pg   MCHC 33.7 31.5 - 35.7 g/dL   RDW 14.4 12.3 - 15.4 %   Platelets 235 150 - 379 x10E3/uL   Neutrophils 75 %   Lymphs 12 %   Monocytes 10 %   Eos 3 %   Basos 0 %   Neutrophils Absolute 7.9 (H) 1.4 - 7.0  x10E3/uL   Lymphocytes Absolute 1.2 0.7 - 3.1 x10E3/uL   Monocytes Absolute 1.1 (H) 0.1 - 0.9 x10E3/uL   EOS (ABSOLUTE) 0.4 0.0 - 0.4 x10E3/uL   Basophils Absolute 0.0 0.0 - 0.2 x10E3/uL   Immature Granulocytes 0 %   Immature Grans (Abs) 0.0 0.0 - 0.1 x10E3/uL  Comprehensive metabolic panel  Result Value Ref Range   Glucose 68 65 - 99 mg/dL   BUN 21 6 - 24 mg/dL   Creatinine, Ser 0.86 0.76 - 1.27 mg/dL   GFR calc non Af Amer 100 >59 mL/min/1.73   GFR calc Af Amer 115 >59 mL/min/1.73   BUN/Creatinine Ratio 24 (H) 9 - 20   Sodium 142 134 - 144 mmol/L   Potassium 4.3 3.5 - 5.2 mmol/L   Chloride 104 96 - 106 mmol/L   CO2 20 18 - 29 mmol/L   Calcium 9.3 8.7 - 10.2 mg/dL   Total Protein 7.1 6.0 - 8.5 g/dL   Albumin 4.4 3.5 - 5.5 g/dL   Globulin, Total 2.7 1.5 - 4.5 g/dL   Albumin/Globulin Ratio 1.6 1.2 - 2.2   Bilirubin Total <0.2 0.0 - 1.2 mg/dL   Alkaline Phosphatase 84 39 - 117 IU/L   AST 34 0 - 40 IU/L   ALT 31 0 - 44 IU/L  Lipid Panel w/o Chol/HDL Ratio  Result Value Ref Range   Cholesterol, Total 202 (H) 100 - 199 mg/dL   Triglycerides 73 0 - 149 mg/dL   HDL 36 (L) >39 mg/dL   VLDL Cholesterol Cal 15 5 - 40 mg/dL   LDL Calculated 151 (H) 0 - 99 mg/dL  Microalbumin, Urine Waived  Result Value Ref Range   Microalb, Ur Waived 80 (H) 0 - 19 mg/L   Creatinine, Urine Waived 100 10 - 300 mg/dL   Microalb/Creat Ratio 30-300 (H) <30 mg/g  PSA  Result Value Ref Range   Prostate Specific Ag, Serum 0.4 0.0 - 4.0 ng/mL  TSH  Result Value Ref Range   TSH 1.370 0.450 - 4.500 uIU/mL  UA/M w/rflx Culture, Routine  Result Value Ref Range   Specific Gravity, UA 1.020 1.005 - 1.030   pH, UA 5.0 5.0 - 7.5   Color, UA Yellow Yellow   Appearance Ur Clear Clear   Leukocytes, UA Negative Negative   Protein, UA Trace Negative/Trace   Glucose, UA Negative Negative   Ketones, UA Negative Negative   RBC, UA 1+ (A) Negative   Bilirubin, UA Negative Negative   Urobilinogen, Ur 0.2 0.2 -  1.0 mg/dL   Nitrite, UA Negative Negative   Microscopic Examination See below:       Assessment & Plan:   Problem List Items Addressed This Visit      Digestive   GERD (gastroesophageal reflux disease)    Stable on current regimen. Continue to monitor.       Relevant Medications   omeprazole (PRILOSEC) 20 MG capsule     Genitourinary   Hypertensive nephropathy    Under good control. Continue current regimen. Continue to monitor.         Other   Tobacco use disorder    He knows we are here if he'd like to quit      Urinary hesitancy    Does not want medicine at this time. Continue to monitor.        Other Visit Diagnoses    Routine general medical examination at a health care facility    -  Primary    Up to date on vaccines. Will check on colonoscopy. Work on diet and exercise. Continue to monitor.     Pain of lower extremity, unspecified laterality        Will work on stretches. Call with any concerns.     Screening for colon cancer        Referral generated today.    Relevant Orders    Ambulatory referral to General Surgery        Discussed aspirin prophylaxis for myocardial infarction prevention and decision was it was not indicated  LABORATORY TESTING:  Health maintenance labs done previously as discussed above.  The natural history of prostate cancer and ongoing controversy regarding screening and potential treatment outcomes of prostate cancer has been discussed with the patient. The meaning of a false positive PSA and a false negative PSA has been discussed. He indicates understanding of the limitations of this screening test and wishes to proceed with screening PSA testing.   IMMUNIZATIONS:   - Tdap: Tetanus vaccination status reviewed: last tetanus booster within 10 years. - Influenza: Up to date - Pneumovax: up to date  SCREENING: - Colonoscopy: Ordered today  Discussed with patient purpose of the colonoscopy is to detect colon cancer at curable  precancerous or early stages   PATIENT COUNSELING:    Sexuality: Discussed sexually transmitted diseases, partner selection, use of condoms, avoidance of unintended pregnancy  and contraceptive alternatives.   Advised to avoid cigarette smoking.  I discussed with the patient that most people either abstain from alcohol or drink within safe limits (<=14/week and <=4 drinks/occasion for males, <=7/weeks and <= 3 drinks/occasion for females) and that the risk for alcohol disorders and other health effects rises proportionally with the number of drinks per week and how often a drinker exceeds daily limits.  Discussed cessation/primary prevention of drug use and availability of treatment for abuse.   Diet: Encouraged to adjust caloric intake to maintain  or achieve ideal body weight, to reduce intake of dietary saturated fat and total fat, to limit sodium intake by avoiding high sodium  foods and not adding table salt, and to maintain adequate dietary potassium and calcium preferably from fresh fruits, vegetables, and low-fat dairy products.    stressed the importance of regular exercise  Injury prevention: Discussed safety belts, safety helmets, smoke detector, smoking near bedding or upholstery.   Dental health: Discussed importance of regular tooth brushing, flossing, and dental visits.   Follow up plan: NEXT PREVENTATIVE PHYSICAL DUE IN 1 YEAR. Return in about 6 months (around 01/07/2016) for BP follow up.

## 2015-07-08 NOTE — Assessment & Plan Note (Signed)
Stable on current regimen. °Continue to monitor.  °

## 2015-07-08 NOTE — Patient Instructions (Addendum)
Health Maintenance, Male A healthy lifestyle and preventative care can promote health and wellness.  Maintain regular health, dental, and eye exams.  Eat a healthy diet. Foods like vegetables, fruits, whole grains, low-fat dairy products, and lean protein foods contain the nutrients you need and are low in calories. Decrease your intake of foods high in solid fats, added sugars, and salt. Get information about a proper diet from your health care provider, if necessary.  Regular physical exercise is one of the most important things you can do for your health. Most adults should get at least 150 minutes of moderate-intensity exercise (any activity that increases your heart rate and causes you to sweat) each week. In addition, most adults need muscle-strengthening exercises on 2 or more days a week.   Maintain a healthy weight. The body mass index (BMI) is a screening tool to identify possible weight problems. It provides an estimate of body fat based on height and weight. Your health care provider can find your BMI and can help you achieve or maintain a healthy weight. For males 20 years and older:  A BMI below 18.5 is considered underweight.  A BMI of 18.5 to 24.9 is normal.  A BMI of 25 to 29.9 is considered overweight.  A BMI of 30 and above is considered obese.  Maintain normal blood lipids and cholesterol by exercising and minimizing your intake of saturated fat. Eat a balanced diet with plenty of fruits and vegetables. Blood tests for lipids and cholesterol should begin at age 20 and be repeated every 5 years. If your lipid or cholesterol levels are high, you are over age 50, or you are at high risk for heart disease, you may need your cholesterol levels checked more frequently.Ongoing high lipid and cholesterol levels should be treated with medicines if diet and exercise are not working.  If you smoke, find out from your health care provider how to quit. If you do not use tobacco, do not  start.  Lung cancer screening is recommended for adults aged 55-80 years who are at high risk for developing lung cancer because of a history of smoking. A yearly low-dose CT scan of the lungs is recommended for people who have at least a 30-pack-year history of smoking and are current smokers or have quit within the past 15 years. A pack year of smoking is smoking an average of 1 pack of cigarettes a day for 1 year (for example, a 30-pack-year history of smoking could mean smoking 1 pack a day for 30 years or 2 packs a day for 15 years). Yearly screening should continue until the smoker has stopped smoking for at least 15 years. Yearly screening should be stopped for people who develop a health problem that would prevent them from having lung cancer treatment.  If you choose to drink alcohol, do not have more than 2 drinks per day. One drink is considered to be 12 oz (360 mL) of beer, 5 oz (150 mL) of wine, or 1.5 oz (45 mL) of liquor.  Avoid the use of street drugs. Do not share needles with anyone. Ask for help if you need support or instructions about stopping the use of drugs.  High blood pressure causes heart disease and increases the risk of stroke. High blood pressure is more likely to develop in:  People who have blood pressure in the end of the normal range (100-139/85-89 mm Hg).  People who are overweight or obese.  People who are African American.    If you are 18-39 years of age, have your blood pressure checked every 3-5 years. If you are 40 years of age or older, have your blood pressure checked every year. You should have your blood pressure measured twice--once when you are at a hospital or clinic, and once when you are not at a hospital or clinic. Record the average of the two measurements. To check your blood pressure when you are not at a hospital or clinic, you can use:  An automated blood pressure machine at a pharmacy.  A home blood pressure monitor.  If you are 45-79 years  old, ask your health care provider if you should take aspirin to prevent heart disease.  Diabetes screening involves taking a blood sample to check your fasting blood sugar level. This should be done once every 3 years after age 45 if you are at a normal weight and without risk factors for diabetes. Testing should be considered at a younger age or be carried out more frequently if you are overweight and have at least 1 risk factor for diabetes.  Colorectal cancer can be detected and often prevented. Most routine colorectal cancer screening begins at the age of 50 and continues through age 75. However, your health care provider may recommend screening at an earlier age if you have risk factors for colon cancer. On a yearly basis, your health care provider may provide home test kits to check for hidden blood in the stool. A small camera at the end of a tube may be used to directly examine the colon (sigmoidoscopy or colonoscopy) to detect the earliest forms of colorectal cancer. Talk to your health care provider about this at age 50 when routine screening begins. A direct exam of the colon should be repeated every 5-10 years through age 75, unless early forms of precancerous polyps or small growths are found.  People who are at an increased risk for hepatitis B should be screened for this virus. You are considered at high risk for hepatitis B if:  You were born in a country where hepatitis B occurs often. Talk with your health care provider about which countries are considered high risk.  Your parents were born in a high-risk country and you have not received a shot to protect against hepatitis B (hepatitis B vaccine).  You have HIV or AIDS.  You use needles to inject street drugs.  You live with, or have sex with, someone who has hepatitis B.  You are a man who has sex with other men (MSM).  You get hemodialysis treatment.  You take certain medicines for conditions like cancer, organ  transplantation, and autoimmune conditions.  Hepatitis C blood testing is recommended for all people born from 1945 through 1965 and any individual with known risk factors for hepatitis C.  Healthy men should no longer receive prostate-specific antigen (PSA) blood tests as part of routine cancer screening. Talk to your health care provider about prostate cancer screening.  Testicular cancer screening is not recommended for adolescents or adult males who have no symptoms. Screening includes self-exam, a health care provider exam, and other screening tests. Consult with your health care provider about any symptoms you have or any concerns you have about testicular cancer.  Practice safe sex. Use condoms and avoid high-risk sexual practices to reduce the spread of sexually transmitted infections (STIs).  You should be screened for STIs, including gonorrhea and chlamydia if:  You are sexually active and are younger than 24 years.  You   are older than 24 years, and your health care provider tells you that you are at risk for this type of infection.  Your sexual activity has changed since you were last screened, and you are at an increased risk for chlamydia or gonorrhea. Ask your health care provider if you are at risk.  If you are at risk of being infected with HIV, it is recommended that you take a prescription medicine daily to prevent HIV infection. This is called pre-exposure prophylaxis (PrEP). You are considered at risk if:  You are a man who has sex with other men (MSM).  You are a heterosexual man who is sexually active with multiple partners.  You take drugs by injection.  You are sexually active with a partner who has HIV.  Talk with your health care provider about whether you are at high risk of being infected with HIV. If you choose to begin PrEP, you should first be tested for HIV. You should then be tested every 3 months for as long as you are taking PrEP.  Use sunscreen. Apply  sunscreen liberally and repeatedly throughout the day. You should seek shade when your shadow is shorter than you. Protect yourself by wearing long sleeves, pants, a wide-brimmed hat, and sunglasses year round whenever you are outdoors.  Tell your health care provider of new moles or changes in moles, especially if there is a change in shape or color. Also, tell your health care provider if a mole is larger than the size of a pencil eraser.  A one-time screening for abdominal aortic aneurysm (AAA) and surgical repair of large AAAs by ultrasound is recommended for men aged 58-75 years who are current or former smokers.  Stay current with your vaccines (immunizations).   This information is not intended to replace advice given to you by your health care provider. Make sure you discuss any questions you have with your health care provider.   Document Released: 07/11/2007 Document Revised: 02/02/2014 Document Reviewed: 06/09/2010 Elsevier Interactive Patient Education 2016 [place]. Hamstring Strain With Rehab The hamstring muscle and tendons are vulnerable to muscle or tendon tear (strain). Hamstring tears cause pain and inflammation in the backside of the thigh, where the hamstring muscles are located. The hamstring is comprised of three muscles that are responsible for straightening the hip, bending the knee, and stabilizing the knee. These muscles are important for walking, running, and jumping. Hamstring strain is the most common injury of the thigh. Hamstring strains are classified as grade 1 or 2 strains. Grade 1 strains cause pain, but the tendon is not lengthened. Grade 2 strains include a lengthened ligament due to the ligament being stretched or partially ruptured. With grade 2 strains there is still function, although the function may be decreased.  SYMPTOMS   Pain, tenderness, swelling, warmth, or redness over the hamstring muscles, at the back of the thigh.  Pain that gets worse  during and after intense activity.  A "pop" heard in the area, at the time of injury.  Muscle spasm in the hamstring muscles.  Pain or weakness with running, jumping, or bending the knee against resistance.  Crackling sound (crepitation) when the tendon is moved or touched.  Bruising (contusion) in the thigh within 48 hours of injury.  Loss of fullness of the muscle, or area of muscle bulging in the case of a complete rupture. CAUSES  A muscle strain occurs when a force is placed on the muscle or tendon that is greater than it  can withstand. Common causes of injury include:  Strain from overuse or sudden increase in the frequency, duration, or intensity of activity.  Single violent blow or force to the back of the knee or the hamstring area of the thigh. RISK INCREASES WITH:  Sports that require quick starts (sprinting, racquetball, tennis).  Sports that require jumping (basketball, volleyball).  Kicking sports and water skiing.  Contact sports (soccer, football).  Poor strength and flexibility.  Failure to warm up properly before activity.  Previous thigh, knee, or pelvis injury.  Poor exercise technique.  Poor posture. PREVENTION  Maintain physical fitness:  Strength, flexibility, and endurance.  Cardiovascular fitness.  Learn and use proper exercise technique and posture.  Wear proper fitted and padded protective equipment. PROGNOSIS  If treated properly, hamstring strains are usually curable in 2 to 6 weeks. RELATED COMPLICATIONS   Longer healing time, if not properly treated or if not given adequate time to heal.  Chronically inflamed tendon, causing persistent pain with activity that may progress to constant pain.  Recurring symptoms, if activity is resumed too soon.  Vulnerable to repeated injury (in up to 25% of cases). TREATMENT  Treatment first involves the use of ice and medication to help reduce pain and inflammation. It is also important to  complete strengthening and stretching exercises, as well as modifying any activities that aggravate the symptoms. These exercises may be completed at home or with a therapist. Your caregiver may recommend the use of crutches to help reduce pain and discomfort, especially is the strain is severe enough to cause limping. If the tendon has pulled away from the bone, then surgery is necessary to reattach it. MEDICATION   If pain medicine is needed, nonsteroidal anti-inflammatory medicines (aspirin and ibuprofen), or other minor pain relievers (acetaminophen), are often advised.  Do not take pain medicine for 7 days before surgery.  Prescription pain relievers may be given if your caregiver thinks they are needed. Use only as directed and only as much as you need.  Corticosteroid injections may be recommended. However, these injections should only be used for serious cases, as they can only be given a certain number of times.  Ointments applied to the skin may be beneficial. HEAT AND COLD  Cold treatment (icing) relieves pain and reduces inflammation. Cold treatment should be applied for 10 to 15 minutes every 2 to 3 hours, and immediately after activity that aggravates your symptoms. Use ice packs or an ice massage.  Heat treatment may be used before performing the stretching and strengthening activities prescribed by your caregiver, physical therapist, or athletic trainer. Use a heat pack or a warm water soak. SEEK MEDICAL CARE IF:   Symptoms get worse or do not improve in 2 weeks, despite treatment.  New, unexplained symptoms develop. (Drugs used in treatment may produce side effects.) EXERCISES RANGE OF MOTION (ROM) AND STRETCHING EXERCISES - Hamstring Strain These exercises may help you when beginning to rehabilitate your injury. Your symptoms may go away with or without further involvement from your physician, physical therapist or athletic trainer. While completing these exercises,  remember:   Restoring tissue flexibility helps normal motion to return to the joints. This allows healthier, less painful movement and activity.  An effective stretch should be held for at least 30 seconds.  A stretch should never be painful. You should only feel a gentle lengthening or release in the stretched tissue. STRETCH - Hamstrings, Standing  Stand or sit, and extend your right / left leg, placing  your foot on a chair or foot stool.  Keep a slight arch in your low back and your hips straight forward.  Lead with your chest, and lean forward at the waist until you feel a gentle stretch in the back of your right / left knee or thigh. (When done correctly, this exercise requires leaning only a small distance.)  Hold this position for __________ seconds. Repeat __________ times. Complete this stretch __________ times per day. STRETCH - Hamstrings, Supine   Lie on your back. Loop a belt or towel over the ball of your right / left foot.  Straighten your right / left knee and slowly pull on the belt to raise your leg. Do not allow the right / left knee to bend. Keep your opposite leg flat on the floor.  Raise the leg until you feel a gentle stretch behind your right / left knee or thigh. Hold this position for __________ seconds. Repeat __________ times. Complete this stretch __________ times per day.  STRETCH - Hamstrings, Doorway  Lie on your back with your right / left leg extended and resting on the wall, and the opposite leg flat on the ground through the door. Initially, position your bottom farther away from the wall.  Keep your right / left knee straight. If you feel a stretch behind your knee or thigh, hold this position for __________ seconds.  If you do not feel a stretch, scoot your bottom closer to the door and hold __________ seconds. Repeat __________ times. Complete this stretch __________ times per day.  STRETCH - Hamstrings/Adductors, V-Sit   Sit on the floor with  your legs extended in a large "V," keeping your knees straight.  With your head and chest upright, bend at your waist reaching for your left foot to stretch your right thigh muscles.  You should feel a stretch in your right inner thigh. Hold for __________ seconds.  Return to the upright position to relax your leg muscles.  Continuing to keep your chest upright, bend straight forward at your waist to stretch your hamstrings.  You should feel a stretch behind both of your thighs and knees. Hold for __________ seconds.  Return to the upright position to relax your leg muscles.  With your head and chest upright, bend at your waist reaching for your right foot to stretch your left thigh muscles.  You should feel a stretch in your left inner thigh. Hold for __________ seconds.  Return to the upright position to relax your leg muscles. Repeat __________ times. Complete this exercise __________ times per day.  STRENGTHENING EXERCISES - Hamstring Strain These exercises may help you when beginning to rehabilitate your injury. They may resolve your symptoms with or without further involvement from your physician, physical therapist or athletic trainer. While completing these exercises, remember:   Muscles can gain both the endurance and the strength needed for everyday activities through controlled exercises.  Complete these exercises as instructed by your physician, physical therapist or athletic trainer. Increase the resistance and repetitions only as guided.  You may experience muscle soreness or fatigue, but the pain or discomfort you are trying to eliminate should never get worse during these exercises. If this pain does get worse, stop and make certain you are following the directions exactly. If the pain is still present after adjustments, discontinue the exercise until you can discuss the trouble with your clinician. STRENGTH - Hip Extensors, Straight Leg Raises   Lie on your stomach on a  firm surface.  Tense the muscles in your buttocks to lift your right / left leg about 4 inches. If you cannot lift your leg this high without arching your back, place a pillow under your hips.  Keep your knee straight. Hold for __________ seconds.  Slowly lower your leg to the starting position and allow it to relax completely before starting the next repetition. Repeat __________ times. Complete this exercise __________ times per day.  STRENGTH - Hamstring, Isometrics   Lie on your back on a firm surface.  Bend your right / left knee approximately __________ degrees.  Dig your heel into the surface, as if you are trying to pull it toward your buttocks. Tighten the muscles in the back of your thighs to "dig" as hard as you can, without increasing any pain.  Hold this position for __________ seconds.  Release the tension gradually and allow your muscles to completely relax for __________ seconds between each exercise. Repeat __________ times. Complete this exercise __________ times per day.  STRENGTH - Hamstring, Curls   Lay on your stomach with your legs extended. (If you lay on a bed, your feet may hang over the edge.)  Tighten the muscles in the back of your thigh to bend your right / left knee up to 90 degrees. Keep your hips flat on the bed or floor.  Hold this position for __________ seconds.  Slowly lower your leg back to the starting position. Repeat __________ times. Complete this exercise __________ times per day.  OPTIONAL ANKLE WEIGHTS: Begin with ____________________, but DO NOT exceed ____________________. Increase in 1 pound/0.5 kilogram increments.   This information is not intended to replace advice given to you by your health care provider. Make sure you discuss any questions you have with your health care provider.   Document Released: 01/12/2005 Document Revised: 02/02/2014 Document Reviewed: 04/26/2008 Elsevier Interactive Patient Education 2016 [employer]. Smoking Cessation, Tips for Success If you are ready to quit smoking, congratulations! You have chosen to help yourself be healthier. Cigarettes bring nicotine, tar, carbon monoxide, and other irritants into your body. Your lungs, heart, and blood vessels will be able to work better without these poisons. There are many different ways to quit smoking. Nicotine gum, nicotine patches, a nicotine inhaler, or nicotine nasal spray can help with physical craving. Hypnosis, support groups, and medicines help break the habit of smoking. WHAT THINGS CAN I DO TO MAKE QUITTING EASIER?  Here are some tips to help you quit for good:  Pick a date when you will quit smoking completely. Tell all of your friends and family about your plan to quit on that date.  Do not try to slowly cut down on the number of cigarettes you are smoking. Pick a quit date and quit smoking completely starting on that day.  Throw away all cigarettes.   Clean and remove all ashtrays from your home, work, and car.  On a card, write down your reasons for quitting. Carry the card with you and read it when you get the urge to smoke.  Cleanse your body of nicotine. Drink enough water and fluids to keep your urine clear or pale yellow. Do this after quitting to flush the nicotine from your body.  Learn to predict your moods. Do not let a bad situation be your excuse to have a cigarette. Some situations in your life might tempt you into wanting a cigarette.  Never have "just one" cigarette. It leads to wanting another and another. Remind yourself of  your decision to quit.  Change habits associated with smoking. If you smoked while driving or when feeling stressed, try other activities to replace smoking. Stand up when drinking your coffee. Brush your teeth after eating. Sit in a different chair when you read the paper. Avoid alcohol while trying to quit, and try to drink fewer caffeinated beverages. Alcohol and caffeine may urge you to  smoke.  Avoid foods and drinks that can trigger a desire to smoke, such as sugary or spicy foods and alcohol.  Ask people who smoke not to smoke around you.  Have something planned to do right after eating or having a cup of coffee. For example, plan to take a walk or exercise.  Try a relaxation exercise to calm you down and decrease your stress. Remember, you may be tense and nervous for the first 2 weeks after you quit, but this will pass.  Find new activities to keep your hands busy. Play with a pen, coin, or rubber band. Doodle or draw things on paper.  Brush your teeth right after eating. This will help cut down on the craving for the taste of tobacco after meals. You can also try mouthwash.   Use oral substitutes in place of cigarettes. Try using lemon drops, carrots, cinnamon sticks, or chewing gum. Keep them handy so they are available when you have the urge to smoke.  When you have the urge to smoke, try deep breathing.  Designate your home as a nonsmoking area.  If you are a heavy smoker, ask your health care provider about a prescription for nicotine chewing gum. It can ease your withdrawal from nicotine.  Reward yourself. Set aside the cigarette money you save and buy yourself something nice.  Look for support from others. Join a support group or smoking cessation program. Ask someone at home or at work to help you with your plan to quit smoking.  Always ask yourself, "Do I need this cigarette or is this just a reflex?" Tell yourself, "Today, I choose not to smoke," or "I do not want to smoke." You are reminding yourself of your decision to quit.  Do not replace cigarette smoking with electronic cigarettes (commonly called e-cigarettes). The safety of e-cigarettes is unknown, and some may contain harmful chemicals.  If you relapse, do not give up! Plan ahead and think about what you will do the next time you get the urge to smoke. HOW WILL I FEEL WHEN I QUIT SMOKING? You  may have symptoms of withdrawal because your body is used to nicotine (the addictive substance in cigarettes). You may crave cigarettes, be irritable, feel very hungry, cough often, get headaches, or have difficulty concentrating. The withdrawal symptoms are only temporary. They are strongest when you first quit but will go away within 10-14 days. When withdrawal symptoms occur, stay in control. Think about your reasons for quitting. Remind yourself that these are signs that your body is healing and getting used to being without cigarettes. Remember that withdrawal symptoms are easier to treat than the major diseases that smoking can cause.  Even after the withdrawal is over, expect periodic urges to smoke. However, these cravings are generally short lived and will go away whether you smoke or not. Do not smoke! WHAT RESOURCES ARE AVAILABLE TO HELP ME QUIT SMOKING? Your health care provider can direct you to community resources or hospitals for support, which may include:  Group support.  Education.  Hypnosis.  Therapy.   This information is not intended  to replace advice given to you by your health care provider. Make sure you discuss any questions you have with your health care provider.   Document Released: 10/11/2003 Document Revised: 02/02/2014 Document Reviewed: 06/30/2012 Elsevier Interactive Patient Education [employer].

## 2015-07-08 NOTE — Assessment & Plan Note (Signed)
Under good control. Continue current regimen. Continue to monitor.  

## 2015-07-08 NOTE — Assessment & Plan Note (Signed)
He knows we are here if he'd like to quit

## 2015-12-17 ENCOUNTER — Other Ambulatory Visit: Payer: Self-pay | Admitting: Family Medicine

## 2016-01-08 ENCOUNTER — Encounter: Payer: Self-pay | Admitting: Family Medicine

## 2016-01-08 ENCOUNTER — Ambulatory Visit (INDEPENDENT_AMBULATORY_CARE_PROVIDER_SITE_OTHER): Payer: BC Managed Care – PPO | Admitting: Family Medicine

## 2016-01-08 VITALS — BP 132/88 | HR 80 | Temp 98.4°F | Wt 164.0 lb

## 2016-01-08 DIAGNOSIS — Z23 Encounter for immunization: Secondary | ICD-10-CM | POA: Diagnosis not present

## 2016-01-08 DIAGNOSIS — K219 Gastro-esophageal reflux disease without esophagitis: Secondary | ICD-10-CM

## 2016-01-08 DIAGNOSIS — R3911 Hesitancy of micturition: Secondary | ICD-10-CM | POA: Diagnosis not present

## 2016-01-08 DIAGNOSIS — Z205 Contact with and (suspected) exposure to viral hepatitis: Secondary | ICD-10-CM | POA: Diagnosis not present

## 2016-01-08 DIAGNOSIS — I129 Hypertensive chronic kidney disease with stage 1 through stage 4 chronic kidney disease, or unspecified chronic kidney disease: Secondary | ICD-10-CM | POA: Diagnosis not present

## 2016-01-08 MED ORDER — OMEPRAZOLE 40 MG PO CPDR: 40.0000 mg | DELAYED_RELEASE_CAPSULE | Freq: Every day | ORAL | 1 refills | Status: DC

## 2016-01-08 MED ORDER — LISINOPRIL 10 MG PO TABS: 10.0000 mg | ORAL_TABLET | Freq: Every day | ORAL | 1 refills | Status: DC

## 2016-01-08 MED ORDER — TAMSULOSIN HCL 0.4 MG PO CAPS
0.4000 mg | ORAL_CAPSULE | Freq: Every day | ORAL | 1 refills | Status: DC
Start: 2016-01-08 — End: 2016-02-18

## 2016-01-08 NOTE — Progress Notes (Signed)
BP 132/88   Pulse 80   Temp 98.4 F (36.9 C)   Wt 164 lb (74.4 kg)   SpO2 100%   BMI 23.87 kg/m    Subjective:    Patient ID: Daniel Mcneil, male    DOB: [birth year], 55 y.o.   MRN: [id number]  HPI: Daniel Mcneil is a 55 y.o. male  Chief Complaint  Patient presents with  . Hypertension  . Bleeding/Bruising   HYPERTENSION Hypertension status: stable  Satisfied with current treatment? yes Duration of hypertension: chronic BP monitoring frequency:  not checking BP medication side effects:  no Medication compliance: excellent compliance Previous BP meds: lisinopril Aspirin: no Recurrent headaches: no Visual changes: no Palpitations: no Dyspnea: no Chest pain: no Lower extremity edema: no Dizzy/lightheaded: no  Has been bruising really easily and skin tears.  GERD GERD control status: uncontrolled  Satisfied with current treatment? no Heartburn frequency: daily Medication side effects: no  Medication compliance: stable Dysphagia: yes Odynophagia:  no Hematemesis: no Blood in stool: no EGD: no  Relevant past medical, surgical, family and social history reviewed and updated as indicated. Interim medical history since our last visit reviewed. Allergies and medications reviewed and updated.  Review of Systems  Constitutional: Negative.   Respiratory: Negative.   Cardiovascular: Negative.   Psychiatric/Behavioral: Negative.    Per HPI unless specifically indicated above     Objective:    BP 132/88   Pulse 80   Temp 98.4 F (36.9 C)   Wt 164 lb (74.4 kg)   SpO2 100%   BMI 23.87 kg/m   Wt Readings from Last 3 Encounters:  01/08/16 164 lb (74.4 kg)  07/08/15 160 lb (72.6 kg)  06/20/15 159 lb 12.8 oz (72.5 kg)    Physical Exam  Constitutional: He is oriented to person, place, and time. He appears well-developed and well-nourished. No distress.  HENT:  Head: Normocephalic and atraumatic.  Right Ear: Hearing normal.  Left Ear: Hearing normal.  Nose:  Nose normal.  Eyes: Conjunctivae and lids are normal. Right eye exhibits no discharge. Left eye exhibits no discharge. No scleral icterus.  Cardiovascular: Normal rate, regular rhythm, normal heart sounds and intact distal pulses.  Exam reveals no gallop and no friction rub.   No murmur heard. Pulmonary/Chest: Effort normal and breath sounds normal. No respiratory distress. He has no wheezes. He has no rales. He exhibits no tenderness.  Musculoskeletal: Normal range of motion.  Neurological: He is alert and oriented to person, place, and time.  Skin: Skin is warm, dry and intact. No rash noted. He is not diaphoretic. No pallor.  Psychiatric: He has a normal mood and affect. His speech is normal and behavior is normal. Judgment and thought content normal. Cognition and memory are normal.  Nursing note and vitals reviewed.   Results for orders placed or performed in visit on 06/20/15  Microscopic Examination  Result Value Ref Range   WBC, UA None seen 0 - 5 /hpf   RBC, UA 0-2 0 - 2 /hpf   Epithelial Cells (non renal) 0-10 0 - 10 /hpf  Lyme Ab/Western Blot Reflex  Result Value Ref Range   Lyme IgG/IgM Ab <0.91 0.00 - 0.90 ISR   LYME DISEASE AB, QUANT, IGM <0.80 0.00 - 0.79 index  Rocky mtn spotted fvr abs pnl(IgG+IgM)  Result Value Ref Range   RMSF IgG Negative Negative   RMSF IgM 0.36 0.00 - 123456 index  Ehrlichia Antibody Panel  Result Value Ref Range   E.Chaffeensis (  HME) IgG Negative Neg:<1:64   E. Chaffeensis (HME) IgM Titer Negative Neg:<1:20   HGE IgG Titer Negative Neg:<1:64   HGE IgM Titer Negative Neg:<1:20  Babesia microti Antibody Panel  Result Value Ref Range   Babesia microti IgM <1:10 Neg:<1:10   Babesia microti IgG <1:10 Neg:<1:10  CBC with Differential/Platelet  Result Value Ref Range   WBC 10.6 3.4 - 10.8 x10E3/uL   RBC 5.55 4.14 - 5.80 x10E6/uL   Hemoglobin 16.7 12.6 - 17.7 g/dL   Hematocrit 49.6 37.5 - 51.0 %   MCV 89 79 - 97 fL   MCH 30.1 26.6 - 33.0 pg    MCHC 33.7 31.5 - 35.7 g/dL   RDW 14.4 12.3 - 15.4 %   Platelets 235 150 - 379 x10E3/uL   Neutrophils 75 %   Lymphs 12 %   Monocytes 10 %   Eos 3 %   Basos 0 %   Neutrophils Absolute 7.9 (H) 1.4 - 7.0 x10E3/uL   Lymphocytes Absolute 1.2 0.7 - 3.1 x10E3/uL   Monocytes Absolute 1.1 (H) 0.1 - 0.9 x10E3/uL   EOS (ABSOLUTE) 0.4 0.0 - 0.4 x10E3/uL   Basophils Absolute 0.0 0.0 - 0.2 x10E3/uL   Immature Granulocytes 0 %   Immature Grans (Abs) 0.0 0.0 - 0.1 x10E3/uL  Comprehensive metabolic panel  Result Value Ref Range   Glucose 68 65 - 99 mg/dL   BUN 21 6 - 24 mg/dL   Creatinine, Ser 0.86 0.76 - 1.27 mg/dL   GFR calc non Af Amer 100 >59 mL/min/1.73   GFR calc Af Amer 115 >59 mL/min/1.73   BUN/Creatinine Ratio 24 (H) 9 - 20   Sodium 142 134 - 144 mmol/L   Potassium 4.3 3.5 - 5.2 mmol/L   Chloride 104 96 - 106 mmol/L   CO2 20 18 - 29 mmol/L   Calcium 9.3 8.7 - 10.2 mg/dL   Total Protein 7.1 6.0 - 8.5 g/dL   Albumin 4.4 3.5 - 5.5 g/dL   Globulin, Total 2.7 1.5 - 4.5 g/dL   Albumin/Globulin Ratio 1.6 1.2 - 2.2   Bilirubin Total <0.2 0.0 - 1.2 mg/dL   Alkaline Phosphatase 84 39 - 117 IU/L   AST 34 0 - 40 IU/L   ALT 31 0 - 44 IU/L  Lipid Panel w/o Chol/HDL Ratio  Result Value Ref Range   Cholesterol, Total 202 (H) 100 - 199 mg/dL   Triglycerides 73 0 - 149 mg/dL   HDL 36 (L) >39 mg/dL   VLDL Cholesterol Cal 15 5 - 40 mg/dL   LDL Calculated 151 (H) 0 - 99 mg/dL  Microalbumin, Urine Waived  Result Value Ref Range   Microalb, Ur Waived 80 (H) 0 - 19 mg/L   Creatinine, Urine Waived 100 10 - 300 mg/dL   Microalb/Creat Ratio 30-300 (H) <30 mg/g  PSA  Result Value Ref Range   Prostate Specific Ag, Serum 0.4 0.0 - 4.0 ng/mL  TSH  Result Value Ref Range   TSH 1.370 0.450 - 4.500 uIU/mL  UA/M w/rflx Culture, Routine  Result Value Ref Range   Specific Gravity, UA 1.020 1.005 - 1.030   pH, UA 5.0 5.0 - 7.5   Color, UA Yellow Yellow   Appearance Ur Clear Clear   Leukocytes, UA  Negative Negative   Protein, UA Trace Negative/Trace   Glucose, UA Negative Negative   Ketones, UA Negative Negative   RBC, UA 1+ (A) Negative   Bilirubin, UA Negative Negative   Urobilinogen, Ur 0.2  0.2 - 1.0 mg/dL   Nitrite, UA Negative Negative   Microscopic Examination See below:       Assessment & Plan:   Problem List Items Addressed This Visit      Digestive   GERD (gastroesophageal reflux disease)    Still having a lot of issues. Will increase omeprazole to 40mg  and recheck in 1 month.       Relevant Medications   omeprazole (PRILOSEC) 40 MG capsule     Genitourinary   Hypertensive nephropathy - Primary    Better on recheck. Continue current regimen. Recheck 1 month with addition of flomax.       Relevant Orders   Basic metabolic panel     Other   Urinary hesitancy    Will start him on flomax. Call with any concerns. Recheck 1 month.        Other Visit Diagnoses    Immunization due       Flu shot given today.   Relevant Orders   Flu Vaccine QUAD 36+ mos PF IM (Fluarix & Fluzone Quad PF) (Completed)   Exposure to viral hepatitis       Will check labs. Await results.    Relevant Orders   Hepatitis, Acute   Hepatitis B Surface AntiBODY       Follow up plan: Return in about 4 weeks (around 02/05/2016) for Follow up flomax, BP and GERD.

## 2016-01-08 NOTE — Assessment & Plan Note (Signed)
Will start him on flomax. Call with any concerns. Recheck 1 month.

## 2016-01-08 NOTE — Assessment & Plan Note (Signed)
Still having a lot of issues. Will increase omeprazole to 40mg  and recheck in 1 month.

## 2016-01-08 NOTE — Assessment & Plan Note (Addendum)
Better on recheck. Continue current regimen. Recheck 1 month with addition of flomax.

## 2016-01-09 ENCOUNTER — Telehealth: Payer: Self-pay | Admitting: Family Medicine

## 2016-01-09 LAB — HEPATITIS PANEL, ACUTE
Hep A IgM: NEGATIVE
Hep B C IgM: NEGATIVE
Hep C Virus Ab: <0.1 s/co ratio (ref 0.0–0.9)
Hepatitis B Surface Ag: NEGATIVE

## 2016-01-09 LAB — BASIC METABOLIC PANEL
BUN/Creatinine Ratio: 21 — ABNORMAL HIGH (ref 9–20)
BUN: 18 mg/dL (ref 6–24)
CALCIUM: 9.4 mg/dL (ref 8.7–10.2)
CO2: 23 mmol/L (ref 18–29)
CREATININE: 0.86 mg/dL (ref 0.76–1.27)
Chloride: 107 mmol/L — ABNORMAL HIGH (ref 96–106)
GFR calc Af Amer: 114 mL/min/{1.73_m2} (ref >59)
GFR, EST NON AFRICAN AMERICAN: 99 mL/min/{1.73_m2} (ref >59)
GLUCOSE: 79 mg/dL (ref 65–99)
Potassium: 4.4 mmol/L (ref 3.5–5.2)
Sodium: 143 mmol/L (ref 134–144)

## 2016-01-09 LAB — HEPATITIS B SURFACE ANTIBODY,QUALITATIVE: HEP B SURFACE AB, QUAL: NONREACTIVE

## 2016-01-09 NOTE — Telephone Encounter (Signed)
Called and [facility] for Daniel Mcneil to call back. He is negative for all the hepatitises and his electrolytes were normal, but he is not immune to hep B and should have the vaccine for it since he has a family member with Hep B to put his mind at ease. He can come in whenever he'd like to get it.

## 2016-01-10 NOTE — Telephone Encounter (Signed)
Made patient aware of his results. He will consider the vaccine and let us know at his follow up.

## 2016-02-06 ENCOUNTER — Ambulatory Visit: Payer: BC Managed Care – PPO | Admitting: Family Medicine

## 2016-02-18 ENCOUNTER — Encounter: Payer: Self-pay | Admitting: Family Medicine

## 2016-02-18 ENCOUNTER — Ambulatory Visit (INDEPENDENT_AMBULATORY_CARE_PROVIDER_SITE_OTHER): Payer: BC Managed Care – PPO | Admitting: Family Medicine

## 2016-02-18 VITALS — BP 118/88 | HR 80 | Temp 97.9°F | Wt 162.8 lb

## 2016-02-18 DIAGNOSIS — Z1211 Encounter for screening for malignant neoplasm of colon: Secondary | ICD-10-CM

## 2016-02-18 DIAGNOSIS — I129 Hypertensive chronic kidney disease with stage 1 through stage 4 chronic kidney disease, or unspecified chronic kidney disease: Secondary | ICD-10-CM | POA: Diagnosis not present

## 2016-02-18 DIAGNOSIS — R3911 Hesitancy of micturition: Secondary | ICD-10-CM | POA: Diagnosis not present

## 2016-02-18 DIAGNOSIS — K219 Gastro-esophageal reflux disease without esophagitis: Secondary | ICD-10-CM

## 2016-02-18 MED ORDER — FINASTERIDE 5 MG PO TABS: 5.0000 mg | ORAL_TABLET | Freq: Every day | ORAL | 1 refills | Status: DC

## 2016-02-18 MED ORDER — LISINOPRIL 10 MG PO TABS: 10.0000 mg | ORAL_TABLET | Freq: Every day | ORAL | 1 refills | Status: DC

## 2016-02-18 MED ORDER — OMEPRAZOLE 20 MG PO CPDR: 40.0000 mg | DELAYED_RELEASE_CAPSULE | Freq: Every day | ORAL | 1 refills | Status: DC

## 2016-02-18 NOTE — Assessment & Plan Note (Signed)
Better on recheck. Call with any concerns. Continue current regimen.

## 2016-02-18 NOTE — Progress Notes (Signed)
BP 118/88   Pulse 80   Temp 97.9 F (36.6 C)   Wt 162 lb 12.8 oz (73.8 kg)   SpO2 95%   BMI 23.70 kg/m    Subjective:    Patient ID: Daniel Mcneil, male    DOB: 03/09/1960, 56 y.o.   MRN: [id number]  HPI: Daniel Mcneil is a 56 y.o. male  Chief Complaint  Patient presents with  . Hypertension  . Benign Prostatic Hypertrophy  . Gastroesophageal Reflux   HYPERTENSION Hypertension status: stable  Satisfied with current treatment? yes Duration of hypertension: chronic BP monitoring frequency:  not checking BP medication side effects:  no Medication compliance: excellent compliance Previous BP meds: lisinopril Aspirin: no Recurrent headaches: no Visual changes: no Palpitations: no Dyspnea: no Chest pain: no Lower extremity edema: no Dizzy/lightheaded: no  BPH- stopped taking the flomax because it was drying him up too much, not bothering him as much. Only bothers him when it's really cold out- would like to start something different- but doesn't want to do flomax again BPH status: stable Satisfied with current treatment?: no Medication side effects: yes Medication compliance: poor compliance Duration: years Nocturia: 2-3x per night Urinary frequency:no Incomplete voiding: yes Urgency: no Weak urinary stream: yes Straining to start stream: yes Dysuria: no Onset: gradual Severity: moderate  GERD- hasn't noticed much of a difference with his 40mg . Only having a problem when he eats something he shouldn't. He notes that the pills are big and he has trouble swallowing them GERD control status: better  Satisfied with current treatment? yes Heartburn frequency: occasionally Medication side effects: no  Medication compliance: excellent Previous GERD medications: Antacid use frequency:   Duration:  Nature:  Location:  Heartburn duration:  Alleviatiating factors:   Aggravating factors:  Dysphagia: no Odynophagia:  no Hematemesis: no Blood in stool: no EGD:  no   Relevant past medical, surgical, family and social history reviewed and updated as indicated. Interim medical history since our last visit reviewed. Allergies and medications reviewed and updated.  Review of Systems  Constitutional: Negative.   Respiratory: Negative.   Cardiovascular: Negative.   Psychiatric/Behavioral: Negative.     Per HPI unless specifically indicated above     Objective:    BP 118/88   Pulse 80   Temp 97.9 F (36.6 C)   Wt 162 lb 12.8 oz (73.8 kg)   SpO2 95%   BMI 23.70 kg/m   Wt Readings from Last 3 Encounters:  02/18/16 162 lb 12.8 oz (73.8 kg)  01/08/16 164 lb (74.4 kg)  07/08/15 160 lb (72.6 kg)    Physical Exam  Constitutional: He is oriented to person, place, and time. He appears well-developed and well-nourished. No distress.  HENT:  Head: Normocephalic and atraumatic.  Right Ear: Hearing normal.  Left Ear: Hearing normal.  Nose: Nose normal.  Eyes: Conjunctivae and lids are normal. Right eye exhibits no discharge. Left eye exhibits no discharge. No scleral icterus.  Cardiovascular: Normal rate, regular rhythm, normal heart sounds and intact distal pulses.  Exam reveals no gallop and no friction rub.   No murmur heard. Pulmonary/Chest: Effort normal and breath sounds normal. No respiratory distress. He has no wheezes. He has no rales. He exhibits no tenderness.  Musculoskeletal: Normal range of motion.  Neurological: He is alert and oriented to person, place, and time.  Skin: Skin is warm, dry and intact. No rash noted. No erythema. No pallor.  Psychiatric: He has a normal mood and affect. His speech is normal  and behavior is normal. Judgment and thought content normal. Cognition and memory are normal.  Nursing note and vitals reviewed.   Results for orders placed or performed in visit on 01/08/16  Hepatitis, Acute  Result Value Ref Range   Hep A IgM Negative Negative   Hepatitis B Surface Ag Negative Negative   Hep B C IgM  Negative Negative   Hep C Virus Ab <0.1 0.0 - 0.9 s/co ratio  Basic metabolic panel  Result Value Ref Range   Glucose 79 65 - 99 mg/dL   BUN 18 6 - 24 mg/dL   Creatinine, Ser 0.86 0.76 - 1.27 mg/dL   GFR calc non Af Amer 99 >59 mL/min/1.73   GFR calc Af Amer 114 >59 mL/min/1.73   BUN/Creatinine Ratio 21 (H) 9 - 20   Sodium 143 134 - 144 mmol/L   Potassium 4.4 3.5 - 5.2 mmol/L   Chloride 107 (H) 96 - 106 mmol/L   CO2 23 18 - 29 mmol/L   Calcium 9.4 8.7 - 10.2 mg/dL  Hepatitis B Surface AntiBODY  Result Value Ref Range   Hep B Surface Ab, Qual Non Reactive       Assessment & Plan:   Problem List Items Addressed This Visit      Digestive   GERD (gastroesophageal reflux disease)   Relevant Medications   omeprazole (PRILOSEC) 20 MG capsule     Genitourinary   Hypertensive nephropathy - Primary    Better on recheck. Call with any concerns. Continue current regimen.         Other   Urinary hesitancy    Didn't tolerate flomax. Will start on avodart. Will check in by phone in 3 weeks to see how he's doing and follow up in 6 months if he tolerates it well.        Other Visit Diagnoses    Screening for colon cancer       Order put in today.   Relevant Orders   Ambulatory referral to General Surgery       Follow up plan: Return in about 6 months (around 08/17/2016) for Physical.

## 2016-02-18 NOTE — Assessment & Plan Note (Signed)
Didn't tolerate flomax. Will start on avodart. Will check in by phone in 3 weeks to see how he's doing and follow up in 6 months if he tolerates it well.

## 2016-02-25 ENCOUNTER — Telehealth: Payer: Self-pay | Admitting: Gastroenterology

## 2016-02-25 NOTE — Telephone Encounter (Signed)
colonoscopy

## 2016-03-10 ENCOUNTER — Telehealth: Payer: Self-pay | Admitting: Family Medicine

## 2016-03-10 ENCOUNTER — Telehealth: Payer: Self-pay | Admitting: Gastroenterology

## 2016-03-10 NOTE — Telephone Encounter (Signed)
-----   Message from Valerie Roys, DO sent at 02/18/2016  9:58 AM EST ----- Call about his BPH

## 2016-03-10 NOTE — Telephone Encounter (Signed)
Called to check in on his urinary symptoms. Wife not sure he's taking the medicine. Left message with her for him to call back. She will have him do so.

## 2016-03-10 NOTE — Telephone Encounter (Signed)
Patient received a letter to call for a colonoscopy. Please call his wife Helene Kelp to make appointment. [phone]

## 2016-03-11 ENCOUNTER — Other Ambulatory Visit: Payer: Self-pay

## 2016-03-11 ENCOUNTER — Telehealth: Payer: Self-pay

## 2016-03-11 NOTE — Telephone Encounter (Signed)
Gastroenterology Pre-Procedure Review  Request Date:  Requesting Physician: Dr.   PATIENT REVIEW QUESTIONS: The patient responded to the following health history questions as indicated:    1. Are you having any GI issues? no 2. Do you have a personal history of Polyps? no 3. Do you have a family history of Colon Cancer or Polyps? no 4. Diabetes Mellitus? no 5. Joint replacements in the past 12 months?no 6. Major health problems in the past 3 months?no 7. Any artificial heart valves, MVP, or defibrillator?no    MEDICATIONS & ALLERGIES:    Patient reports the following regarding taking any anticoagulation/antiplatelet therapy:   Plavix, Coumadin, Eliquis, Xarelto, Lovenox, Pradaxa, Brilinta, or Effient? no Aspirin? no  Patient confirms/reports the following medications:  Current Outpatient Prescriptions  Medication Sig Dispense Refill  . finasteride (PROSCAR) 5 MG tablet Take 1 tablet (5 mg total) by mouth daily. 30 tablet 1  . lisinopril (PRINIVIL,ZESTRIL) 10 MG tablet Take 1 tablet (10 mg total) by mouth daily. 90 tablet 1  . omeprazole (PRILOSEC) 20 MG capsule Take 2 capsules (40 mg total) by mouth daily. 90 capsule 1   No current facility-administered medications for this visit.     Patient confirms/reports the following allergies:  Allergies  Allergen Reactions  . Flomax [Tamsulosin]     dizziness    No orders of the defined types were placed in this encounter.   AUTHORIZATION INFORMATION Primary Insurance: 1D#: Group #:  Secondary Insurance: 1D#: Group #:  SCHEDULE INFORMATION: Date: 04/10/16 Time: Location: [place]

## 2016-03-11 NOTE — Telephone Encounter (Signed)
Pt scheduled for colonoscopy at [facility] on 04/10/16 with [facility]ohl. Instructs/rx mailed.

## 2016-03-11 NOTE — Telephone Encounter (Signed)
LVM for pt to return my call.

## 2016-03-17 NOTE — Telephone Encounter (Signed)
Pt scheduled for colonoscopy on 04/10/16.

## 2016-04-06 ENCOUNTER — Encounter: Payer: Self-pay | Admitting: *Deleted

## 2016-04-09 NOTE — Discharge Instructions (Signed)

## 2016-04-10 ENCOUNTER — Ambulatory Visit
Admission: RE | Admit: 2016-04-10 | Discharge: 2016-04-10 | Disposition: A | Discharge status: Home / Self Care | Payer: BC Managed Care – PPO | Source: Ambulatory Visit | Attending: Gastroenterology | Admitting: Gastroenterology

## 2016-04-10 ENCOUNTER — Ambulatory Visit: Payer: BC Managed Care – PPO | Admitting: Anesthesiology

## 2016-04-10 ENCOUNTER — Encounter
Admission: RE | Disposition: A | Discharge status: Home / Self Care | Payer: Self-pay | Source: Ambulatory Visit | Attending: Gastroenterology

## 2016-04-10 DIAGNOSIS — K640 First degree hemorrhoids: Secondary | ICD-10-CM | POA: Diagnosis not present

## 2016-04-10 DIAGNOSIS — K219 Gastro-esophageal reflux disease without esophagitis: Secondary | ICD-10-CM | POA: Insufficient documentation

## 2016-04-10 DIAGNOSIS — Z8249 Family history of ischemic heart disease and other diseases of the circulatory system: Secondary | ICD-10-CM | POA: Diagnosis not present

## 2016-04-10 DIAGNOSIS — Z833 Family history of diabetes mellitus: Secondary | ICD-10-CM | POA: Diagnosis not present

## 2016-04-10 DIAGNOSIS — M5126 Other intervertebral disc displacement, lumbar region: Secondary | ICD-10-CM | POA: Diagnosis not present

## 2016-04-10 DIAGNOSIS — Z7982 Long term (current) use of aspirin: Secondary | ICD-10-CM | POA: Insufficient documentation

## 2016-04-10 DIAGNOSIS — Z87891 Personal history of nicotine dependence: Secondary | ICD-10-CM | POA: Diagnosis not present

## 2016-04-10 DIAGNOSIS — E1122 Type 2 diabetes mellitus with diabetic chronic kidney disease: Secondary | ICD-10-CM | POA: Diagnosis not present

## 2016-04-10 DIAGNOSIS — Z888 Allergy status to other drugs, medicaments and biological substances status: Secondary | ICD-10-CM | POA: Insufficient documentation

## 2016-04-10 DIAGNOSIS — E1140 Type 2 diabetes mellitus with diabetic neuropathy, unspecified: Secondary | ICD-10-CM | POA: Insufficient documentation

## 2016-04-10 DIAGNOSIS — N181 Chronic kidney disease, stage 1: Secondary | ICD-10-CM | POA: Diagnosis not present

## 2016-04-10 DIAGNOSIS — Z1211 Encounter for screening for malignant neoplasm of colon: Secondary | ICD-10-CM | POA: Diagnosis not present

## 2016-04-10 DIAGNOSIS — Z823 Family history of stroke: Secondary | ICD-10-CM | POA: Diagnosis not present

## 2016-04-10 DIAGNOSIS — K5730 Diverticulosis of large intestine without perforation or abscess without bleeding: Secondary | ICD-10-CM | POA: Diagnosis not present

## 2016-04-10 DIAGNOSIS — K621 Rectal polyp: Secondary | ICD-10-CM | POA: Diagnosis not present

## 2016-04-10 DIAGNOSIS — Z809 Family history of malignant neoplasm, unspecified: Secondary | ICD-10-CM | POA: Insufficient documentation

## 2016-04-10 DIAGNOSIS — F17210 Nicotine dependence, cigarettes, uncomplicated: Secondary | ICD-10-CM | POA: Insufficient documentation

## 2016-04-10 DIAGNOSIS — Z79899 Other long term (current) drug therapy: Secondary | ICD-10-CM | POA: Insufficient documentation

## 2016-04-10 DIAGNOSIS — M5020 Other cervical disc displacement, unspecified cervical region: Secondary | ICD-10-CM | POA: Diagnosis not present

## 2016-04-10 DIAGNOSIS — D122 Benign neoplasm of ascending colon: Secondary | ICD-10-CM | POA: Diagnosis not present

## 2016-04-10 DIAGNOSIS — I129 Hypertensive chronic kidney disease with stage 1 through stage 4 chronic kidney disease, or unspecified chronic kidney disease: Secondary | ICD-10-CM | POA: Diagnosis not present

## 2016-04-10 HISTORY — PX: BIOPSY: SHX5522

## 2016-04-10 HISTORY — PX: COLONOSCOPY WITH PROPOFOL: SHX5780

## 2016-04-10 HISTORY — DX: Gastro-esophageal reflux disease without esophagitis: K21.9

## 2016-04-10 SURGERY — COLONOSCOPY WITH PROPOFOL
Anesthesia: Monitor Anesthesia Care | Site: Rectum | Wound class: Contaminated

## 2016-04-10 MED ORDER — PROPOFOL 10 MG/ML IV BOLUS
INTRAVENOUS | Status: DC | PRN
  Administered 2016-04-10: 50 mg via INTRAVENOUS
  Administered 2016-04-10: 100 mg via INTRAVENOUS
  Administered 2016-04-10: 20 mg via INTRAVENOUS
  Administered 2016-04-10: 50 mg via INTRAVENOUS

## 2016-04-10 MED ORDER — LACTATED RINGERS IV SOLN
INTRAVENOUS | Status: DC
  Administered 2016-04-10: 08:00:00 via INTRAVENOUS

## 2016-04-10 MED ORDER — STERILE WATER FOR IRRIGATION IR SOLN
Status: DC | PRN
  Administered 2016-04-10: 09:00:00

## 2016-04-10 MED ORDER — LIDOCAINE HCL (CARDIAC) 20 MG/ML IV SOLN
INTRAVENOUS | Status: DC | PRN
  Administered 2016-04-10: 50 mg via INTRAVENOUS

## 2016-04-10 SURGICAL SUPPLY — 23 items
CANISTER SUCT 1200ML W/VALVE (MISCELLANEOUS) ×3 IMPLANT
CLIP HMST 235XBRD CATH ROT (MISCELLANEOUS) IMPLANT
CLIP RESOLUTION 360 11X235 (MISCELLANEOUS)
FCP ESCP3.2XJMB 240X2.8X (MISCELLANEOUS) ×1
FORCEPS BIOP RAD 4 LRG CAP 4 (CUTTING FORCEPS) IMPLANT
FORCEPS BIOP RJ4 240 W/NDL (MISCELLANEOUS) ×2
FORCEPS ESCP3.2XJMB 240X2.8X (MISCELLANEOUS) ×1 IMPLANT
GOWN CVR UNV OPN BCK APRN NK (MISCELLANEOUS) ×2 IMPLANT
GOWN ISOL THUMB LOOP REG UNIV (MISCELLANEOUS) ×4
INJECTOR VARIJECT VIN23 (MISCELLANEOUS) IMPLANT
KIT DEFENDO VALVE AND CONN (KITS) IMPLANT
KIT ENDO PROCEDURE OLY (KITS) ×3 IMPLANT
MARKER SPOT ENDO TATTOO 5ML (MISCELLANEOUS) IMPLANT
PAD GROUND ADULT SPLIT (MISCELLANEOUS) IMPLANT
PROBE APC STR FIRE (PROBE) IMPLANT
RETRIEVER NET ROTH 2.5X230 LF (MISCELLANEOUS) IMPLANT
SNARE SHORT THROW 13M SML OVAL (MISCELLANEOUS) IMPLANT
SNARE SHORT THROW 30M LRG OVAL (MISCELLANEOUS) IMPLANT
SNARE SNG USE RND 15MM (INSTRUMENTS) IMPLANT
SPOT EX ENDOSCOPIC TATTOO (MISCELLANEOUS)
TRAP ETRAP POLY (MISCELLANEOUS) IMPLANT
VARIJECT INJECTOR VIN23 (MISCELLANEOUS)
WATER STERILE IRR 250ML POUR (IV SOLUTION) ×3 IMPLANT

## 2016-04-10 NOTE — Op Note (Signed)
[facility] Gastroenterology Patient Name: Daniel Mcneil Procedure Date: 04/10/2016 9:10 AM MRN: [id number] Account #: [id number] Date of Birth: 07-30-1960 Admit Type: Outpatient Age: 56 Room: [facility] OR ROOM 01 Gender: Male Note Status: Finalized Procedure:            Colonoscopy Indications:          Screening for colorectal malignant neoplasm Providers:            [name] MD, MD Referring MD:         Valerie Roys (Referring MD) Medicines:            Propofol per Anesthesia Complications:        No immediate complications. Procedure:            Pre-Anesthesia Assessment:                       - Prior to the procedure, a History and Physical was                        performed, and patient medications and allergies were                        reviewed. The patient's tolerance of previous                        anesthesia was also reviewed. The risks and benefits of                        the procedure and the sedation options and risks were                        discussed with the patient. All questions were                        answered, and informed consent was obtained. Prior                        Anticoagulants: The patient has taken no previous                        anticoagulant or antiplatelet agents. ASA Grade                        Assessment: II - A patient with mild systemic disease.                        After reviewing the risks and benefits, the patient was                        deemed in satisfactory condition to undergo the                        procedure.                       After obtaining informed consent, the colonoscope was                        passed under direct vision. Throughout the procedure,  the patient's blood pressure, pulse, and oxygen                        saturations were monitored continuously. The [place] [id number]) was introduced through the                   anus and advanced to the the cecum, identified by                        appendiceal orifice and ileocecal valve. The                        colonoscopy was performed without difficulty. The                        patient tolerated the procedure well. The quality of                        the bowel preparation was excellent. Findings:      The perianal and digital rectal examinations were normal.      A 3 mm polyp was found in the ascending colon. The polyp was sessile.       The polyp was removed with a cold biopsy forceps. Resection and       retrieval were complete.      A 5 mm polyp was found in the rectum. The polyp was sessile. The polyp       was removed with a cold biopsy forceps. Resection and retrieval were       complete.      Multiple small-mouthed diverticula were found in the sigmoid colon.      Non-bleeding internal hemorrhoids were found during retroflexion. The       hemorrhoids were Grade I (internal hemorrhoids that do not prolapse). Impression:           - One 3 mm polyp in the ascending colon, removed with a                        cold biopsy forceps. Resected and retrieved.                       - One 5 mm polyp in the rectum, removed with a cold                        biopsy forceps. Resected and retrieved.                       - Diverticulosis in the sigmoid colon.                       - Non-bleeding internal hemorrhoids. Recommendation:       - Discharge patient to home.                       - Resume previous diet.                       - Continue present medications.                       -  Await pathology results.                       - Repeat colonoscopy in 5 years if polyp adenoma and 10                        years if hyperplastic Procedure Code(s):    --- Professional ---                       [phone], Colonoscopy, flexible; with biopsy, single or                        multiple Diagnosis Code(s):    --- Professional ---                        Z12.11, Encounter for screening for malignant neoplasm                        of colon                       D12.2, Benign neoplasm of ascending colon                       K62.1, Rectal polyp CPT copyright 2016 [employer]. All rights reserved. The codes documented in this report are preliminary and upon coder review may  be revised to meet current compliance requirements. [name] MD, MD 04/10/2016 9:31:34 AM This report has been signed electronically. Number of Addenda: 0 Note Initiated On: 04/10/2016 9:10 AM Scope Withdrawal Time: 0 hours 7 minutes 28 seconds  Total Procedure Duration: 0 hours 9 minutes 30 seconds       [facility]

## 2016-04-10 NOTE — H&P (Signed)
Daniel Lame, MD [facility] [address], [place], [place] [address] Phone: [phone] Fax : [phone]  Primary Care Physician:  Park Liter, DO Primary Gastroenterologist:  Dr. Allen Norris  Pre-Procedure History & Physical: HPI:  Daniel Mcneil is a 56 y.o. male is here for a screening colonoscopy.   Past Medical History:  Diagnosis Date  . Chronic kidney disease, stage I   . Dysphagia   . GERD (gastroesophageal reflux disease)   . Herniated disc, cervical   . Hypertension   . Hypertensive nephropathy   . Lumbar herniated disc   . Tobacco use disorder   . Urinary hesitancy     Past Surgical History:  Procedure Laterality Date  . HERNIA REPAIR      Prior to Admission medications   Medication Sig Start Date End Date Taking? Authorizing Provider  Aspirin-Salicylamide-Caffeine (BC HEADACHE POWDER PO) Take by mouth as needed.   Yes Historical Provider, MD  lisinopril (PRINIVIL,ZESTRIL) 10 MG tablet Take 1 tablet (10 mg total) by mouth daily. 02/18/16  Yes Megan P Johnson, DO  Multiple Vitamins-Minerals (EQ MULTIVITAMINS ADULT GUMMY PO) Take by mouth daily.   Yes Historical Provider, MD  naproxen sodium (ANAPROX) 220 MG tablet Take 220 mg by mouth as needed.   Yes Historical Provider, MD  omeprazole (PRILOSEC) 20 MG capsule Take 2 capsules (40 mg total) by mouth daily. 02/18/16  Yes Megan P Johnson, DO  finasteride (PROSCAR) 5 MG tablet Take 1 tablet (5 mg total) by mouth daily. Patient not taking: Reported on 04/06/2016 02/18/16   Valerie Roys, DO    Allergies as of 03/11/2016 - Review Complete 02/18/2016  Allergen Reaction Noted  . Flomax [tamsulosin]  02/18/2016    Family History  Problem Relation Age of Onset  . Stroke Mother   . Hypertension Mother   . Cancer Father   . Hypertension Father   . Diabetes Father   . Heart disease Father   . Cancer Maternal Grandfather   . Hypertension Sister     Social History   Social History  . Marital status: Married   Spouse name: [name]  . Number of children: [name]  . Years of education: [name]   Occupational History  . Not on file.   Social History Main Topics  . Smoking status: Current Every Day Smoker    Packs/day: 1.50    Years: 36.00    Types: Cigarettes  . Smokeless tobacco: Never Used     Comment: since age 60.  Marland Kitchen Alcohol use 9.0 oz/week    15 Cans of beer per week     Comment:    . Drug use: No  . Sexual activity: Yes    Birth control/ protection: None   Other Topics Concern  . Not on file   Social History Narrative  . No narrative on file    Review of Systems: See HPI, otherwise negative ROS  Physical Exam: BP (!) 121/95   Pulse 88   Temp 97.7 F (36.5 C) (Temporal)   Ht 5' 9.5" (1.765 m)   Wt 158 lb (71.7 kg)   SpO2 95%   BMI 23.00 kg/m  General:   Alert,  pleasant and cooperative in NAD Head:  Normocephalic and atraumatic. Neck:  Supple; no masses or thyromegaly. Lungs:  Clear throughout to auscultation.    Heart:  Regular rate and rhythm. Abdomen:  Soft, nontender and nondistended. Normal bowel sounds, without guarding, and without rebound.   Neurologic:  Alert and  oriented x4;  grossly normal neurologically.  Impression/Plan: Rock Sobol is now here to undergo a screening colonoscopy.  Risks, benefits, and alternatives regarding colonoscopy have been reviewed with the patient.  Questions have been answered.  All parties agreeable.

## 2016-04-10 NOTE — Anesthesia Procedure Notes (Signed)
Procedure Name: MAC Performed by: Miko Markwood Pre-anesthesia Checklist: Patient identified, Emergency Drugs available, Suction available, Timeout performed and Patient being monitored Patient Re-evaluated:Patient Re-evaluated prior to inductionOxygen Delivery Method: Nasal cannula Placement Confirmation: positive ETCO2     

## 2016-04-10 NOTE — Anesthesia Preprocedure Evaluation (Signed)
Anesthesia Evaluation  Patient identified by MRN, date of birth, ID band  Reviewed: NPO status   History of Anesthesia Complications Negative for: history of anesthetic complications  Airway Mallampati: II  TM Distance: >3 FB Neck ROM: full    Dental  (+) Loose,    Pulmonary Current Smoker,    Pulmonary exam normal        Cardiovascular Exercise Tolerance: Good hypertension, Normal cardiovascular exam     Neuro/Psych negative neurological ROS  negative psych ROS   GI/Hepatic Neg liver ROS, GERD  Controlled,  Endo/Other  negative endocrine ROS  Renal/GU negative Renal ROS   bph    Musculoskeletal Herniated disc, cervical   Abdominal   Peds  Hematology negative hematology ROS (+)   Anesthesia Other Findings   Reproductive/Obstetrics                             Anesthesia Physical Anesthesia Plan  ASA: II  Anesthesia Plan: MAC   Post-op Pain Management:    Induction:   Airway Management Planned:   Additional Equipment:   Intra-op Plan:   Post-operative Plan:   Informed Consent: I have reviewed the patients History and Physical, chart, labs and discussed the procedure including the risks, benefits and alternatives for the proposed anesthesia with the patient or authorized representative who has indicated his/her understanding and acceptance.     Plan Discussed with: CRNA  Anesthesia Plan Comments:         Anesthesia Quick Evaluation

## 2016-04-10 NOTE — Anesthesia Postprocedure Evaluation (Signed)
Anesthesia Post Note  Patient: Daniel Mcneil  Procedure(s) Performed: Procedure(s) (LRB): COLONOSCOPY WITH PROPOFOL ([name]) BIOPSY ([name])  Patient location during evaluation: PACU Anesthesia Type: MAC Level of consciousness: awake and alert Pain management: pain level controlled Vital Signs Assessment: post-procedure vital signs reviewed and stable Respiratory status: spontaneous breathing, nonlabored ventilation, respiratory function stable and patient connected to nasal cannula oxygen Cardiovascular status: stable and blood pressure returned to baseline Anesthetic complications: no    Kesean Serviss

## 2016-04-10 NOTE — Transfer of Care (Signed)
Immediate Anesthesia Transfer of Care Note  Patient: Daniel Mcneil  Procedure(s) Performed: Procedure(s) with comments: COLONOSCOPY WITH PROPOFOL ([name]) - requests - not first BIOPSY ([name])  Patient Location: PACU  Anesthesia Type: MAC  Level of Consciousness: awake, alert  and patient cooperative  Airway and Oxygen Therapy: Patient Spontanous Breathing and Patient connected to supplemental oxygen  Post-op Assessment: Post-op Vital signs reviewed, Patient's Cardiovascular Status Stable, Respiratory Function Stable, Patent Airway and No signs of Nausea or vomiting  Post-op Vital Signs: Reviewed and stable  Complications: No apparent anesthesia complications

## 2016-04-13 ENCOUNTER — Encounter: Payer: Self-pay | Admitting: Gastroenterology

## 2016-04-14 ENCOUNTER — Encounter: Payer: Self-pay | Admitting: Gastroenterology

## 2016-05-18 ENCOUNTER — Ambulatory Visit
Admission: EM | Admit: 2016-05-18 | Discharge: 2016-05-18 | Disposition: A | Discharge status: Home / Self Care | Payer: BC Managed Care – PPO | Attending: Family Medicine | Admitting: Family Medicine

## 2016-05-18 DIAGNOSIS — S0101XA Laceration without foreign body of scalp, initial encounter: Secondary | ICD-10-CM

## 2016-05-18 DIAGNOSIS — W208XXA Other cause of strike by thrown, projected or falling object, initial encounter: Secondary | ICD-10-CM | POA: Diagnosis not present

## 2016-05-18 MED ORDER — MUPIROCIN 2 % EX OINT: 1.0000 "application " | TOPICAL_OINTMENT | Freq: Three times a day (TID) | CUTANEOUS | 0 refills | Status: DC

## 2016-05-18 NOTE — ED Triage Notes (Addendum)
Patient complains of laceration to top of head that occurred around 12pm today. Patient states that the arm of a attic ladder hit him while working on ladder. Patient reports bleeding. Patient denies loosing consciousness. Patient states that it did stun him at first.

## 2016-05-18 NOTE — ED Provider Notes (Signed)
CSN: [id number]     Arrival date & time 05/18/16  1445 History   First MD Initiated Contact with Patient 05/18/16 1526     Chief Complaint  Patient presents with  . Head Laceration   (Consider location/radiation/quality/duration/timing/severity/associated sxs/prior Treatment) HPI  56 year old male who presents with laceration to the top of his head that occurred around 12 PM today. States that a arm from an attic ladder secured and rebounded backwards hitting him at the top of his head with a metal bracket. She did did not have any loss of consciousness. He has not had any visual disturbances is not Had lack of concentration. He is current on his tetanus toxoid within the last 2 years.       Past Medical History:  Diagnosis Date  . Chronic kidney disease, stage I   . Dysphagia   . GERD (gastroesophageal reflux disease)   . Herniated disc, cervical   . Hypertension   . Hypertensive nephropathy   . Lumbar herniated disc   . Tobacco use disorder   . Urinary hesitancy    Past Surgical History:  Procedure Laterality Date  . BIOPSY [name] 04/10/2016   Procedure: BIOPSY;  Surgeon: [name], MD;  Location: [facility] [place];  Service: Endoscopy;  Laterality: [name];  . COLONOSCOPY WITH PROPOFOL [name] 04/10/2016   Procedure: COLONOSCOPY WITH PROPOFOL;  Surgeon: [name], MD;  Location: [facility];  Service: Endoscopy;  Laterality: [name];  requests - not first  . HERNIA REPAIR     Family History  Problem Relation Age of Onset  . Stroke Mother   . Hypertension Mother   . Cancer Father   . Hypertension Father   . Diabetes Father   . Heart disease Father   . Cancer Maternal Grandfather   . Hypertension Sister    Social History  Substance Use Topics  . Smoking status: Current Every Day Smoker    Packs/day: 1.50    Years: 36.00    Types: Cigarettes  . Smokeless tobacco: Never Used     Comment: since age 83.  Marland Kitchen Alcohol use 9.0 oz/week    15 Cans of beer per week   Comment:      Review of Systems  Constitutional: Positive for activity change. Negative for chills, fatigue and fever.  Neurological: Negative for dizziness, seizures, syncope, numbness and headaches.  All other systems reviewed and are negative.   Allergies  Flomax [tamsulosin]  Home Medications   Prior to Admission medications   Medication Sig Start Date End Date Taking? Authorizing Provider  Aspirin-Salicylamide-Caffeine (BC HEADACHE POWDER PO) Take by mouth as needed.   Yes Historical Provider, MD  lisinopril (PRINIVIL,ZESTRIL) 10 MG tablet Take 1 tablet (10 mg total) by mouth daily. 02/18/16  Yes Megan P Johnson, DO  Multiple Vitamins-Minerals (EQ MULTIVITAMINS ADULT GUMMY PO) Take by mouth daily.   Yes Historical Provider, MD  naproxen sodium (ANAPROX) 220 MG tablet Take 220 mg by mouth as needed.   Yes Historical Provider, MD  omeprazole (PRILOSEC) 20 MG capsule Take 2 capsules (40 mg total) by mouth daily. 02/18/16  Yes Megan P Johnson, DO  mupirocin ointment (BACTROBAN) 2 % Apply 1 application topically 3 (three) times daily. 05/18/16   Lorin Picket, PA-C   Meds Ordered and Administered this Visit  Medications - No data to display  BP (!) 140/98 (BP Location: Left Arm)   Pulse 80   Temp 99 F (37.2 C) (Oral)   Resp 16   Ht  5\' 10"  (1.778 m)   Wt 165 lb (74.8 kg)   SpO2 97%   BMI 23.68 kg/m  No data found.   Physical Exam  Constitutional: He is oriented to person, place, and time. He appears well-developed and well-nourished. No distress.  HENT:  Head: Normocephalic and atraumatic.  Patient has a 4 cm laceration in the scalp over the parietal area on the right extending to the skull. No defects are palpable.  Eyes: EOM are normal. Pupils are equal, round, and reactive to light. Right eye exhibits no discharge. Left eye exhibits no discharge.  Neck: Normal range of motion. Neck supple.  Musculoskeletal: Normal range of motion.  Neurological: He is alert and  oriented to person, place, and time. No cranial nerve deficit or sensory deficit. He exhibits normal muscle tone. Coordination normal.  Skin: Skin is warm and dry. He is not diaphoretic.  Psychiatric: He has a normal mood and affect. His behavior is normal. Judgment and thought content normal.  Nursing note and vitals reviewed.   Urgent Care Course     .Marland KitchenLaceration Repair Date/Time: 05/18/2016 4:54 PM Performed by: Lorin Picket Authorized by: Frederich Cha   Consent:    Consent obtained:  Verbal   Consent given by:  Patient   Risks discussed:  Infection and pain Anesthesia (see MAR for exact dosages):    Anesthesia method:  Local infiltration   Local anesthetic:  Lidocaine 1% WITH epi Laceration details:    Location:  Scalp   Scalp location:  R parietal   Length (cm):  4   Depth (mm):  4 Repair type:    Repair type:  Simple Pre-procedure details:    Preparation:  Patient was prepped and draped in usual sterile fashion Exploration:    Hemostasis achieved with:  Direct pressure and epinephrine   Wound exploration: entire depth of wound probed and visualized     Wound extent: areolar tissue violated     Contaminated: no   Treatment:    Area cleansed with:  Shur-Clens   Amount of cleaning:  Extensive   Irrigation method:  Tap   Visualized foreign bodies/material removed: no   Skin repair:    Repair method:  Staples   Number of staples:  5 Approximation:    Approximation:  Close   Vermilion border: well-aligned   Post-procedure details:    Dressing:  Antibiotic ointment   Patient tolerance of procedure:  Tolerated well, no immediate complications   (including critical care time)  Labs Review Labs Reviewed - No data to display  Imaging Review No results found.   Visual Acuity Review  Right Eye Distance:   Left Eye Distance:   Bilateral Distance:    Right Eye Near:   Left Eye Near:    Bilateral Near:         MDM   1. Laceration of scalp, initial  encounter    Discharge Medication List as of 05/18/2016  4:12 PM    START taking these medications   Details  mupirocin ointment (BACTROBAN) 2 % Apply 1 application topically 3 (three) times daily., Starting Mon 05/18/2016, Normal      Plan: 1. Test/x-ray results and diagnosis reviewed with patient 2. rx as per orders; risks, benefits, potential side effects reviewed with patient 3. Recommend supportive treatment with Observation for any changes in mentation or centration. If any of these happen he should go immediately to the emergency room. Keep the wound dry for 24 hours and then wash it  daily. I've asked him to apply Bactroban ointment to the area 3 times daily. We'll plan on staple removal in 7 days. He has any discharge from the area or increasing pain she should return to our clinic for evaluation. 4. F/u prn if symptoms worsen or don't improve     Lorin Picket, PA-C 05/18/16 1700

## 2016-05-25 ENCOUNTER — Encounter: Payer: Self-pay | Admitting: *Deleted

## 2016-05-25 ENCOUNTER — Ambulatory Visit
Admission: EM | Admit: 2016-05-25 | Discharge: 2016-05-25 | Disposition: A | Discharge status: Home / Self Care | Payer: BC Managed Care – PPO

## 2016-05-25 NOTE — ED Triage Notes (Signed)
PAtient has arrived for staple removal. 6 staples removed. Laceration well healed with no drainage or redness.

## 2016-07-04 ENCOUNTER — Other Ambulatory Visit: Payer: Self-pay | Admitting: Family Medicine

## 2016-08-19 ENCOUNTER — Encounter: Payer: Self-pay | Admitting: Family Medicine

## 2016-08-19 ENCOUNTER — Ambulatory Visit (INDEPENDENT_AMBULATORY_CARE_PROVIDER_SITE_OTHER): Payer: BC Managed Care – PPO | Admitting: Family Medicine

## 2016-08-19 VITALS — BP 122/82 | HR 73 | Temp 98.7°F | Wt 158.2 lb

## 2016-08-19 DIAGNOSIS — Z0000 Encounter for general adult medical examination without abnormal findings: Secondary | ICD-10-CM | POA: Diagnosis not present

## 2016-08-19 DIAGNOSIS — I129 Hypertensive chronic kidney disease with stage 1 through stage 4 chronic kidney disease, or unspecified chronic kidney disease: Secondary | ICD-10-CM

## 2016-08-19 DIAGNOSIS — Z13220 Encounter for screening for lipoid disorders: Secondary | ICD-10-CM

## 2016-08-19 DIAGNOSIS — K219 Gastro-esophageal reflux disease without esophagitis: Secondary | ICD-10-CM

## 2016-08-19 DIAGNOSIS — W57XXXA Bitten or stung by nonvenomous insect and other nonvenomous arthropods, initial encounter: Secondary | ICD-10-CM

## 2016-08-19 DIAGNOSIS — Z125 Encounter for screening for malignant neoplasm of prostate: Secondary | ICD-10-CM

## 2016-08-19 MED ORDER — LISINOPRIL 10 MG PO TABS: 10.0000 mg | ORAL_TABLET | Freq: Every day | ORAL | 1 refills | Status: DC

## 2016-08-19 MED ORDER — OMEPRAZOLE 20 MG PO CPDR: 20.0000 mg | DELAYED_RELEASE_CAPSULE | Freq: Two times a day (BID) | ORAL | 1 refills | Status: DC

## 2016-08-19 NOTE — Assessment & Plan Note (Signed)
Under good control. Continue current regimen. Continue to monitor. Refills given today. Call with any concerns.  

## 2016-08-19 NOTE — Patient Instructions (Addendum)

## 2016-08-19 NOTE — Progress Notes (Deleted)
   There were no vitals taken for this visit.   Subjective:    Patient ID: Daniel Mcneil, male    DOB: 1960/01/28, 56 y.o.   MRN: [id number]  HPI: Daniel Mcneil is a 56 y.o. male  No chief complaint on file.  HYPERTENSION Hypertension status: {Blank single:19197::"controlled","uncontrolled","better","worse","exacerbated","stable"}  Satisfied with current treatment? {Blank single:19197::"yes","no"} Duration of hypertension: {Blank single:19197::"chronic","months","years"} BP monitoring frequency:  {Blank single:19197::"not checking","rarely","daily","weekly","monthly","a few times a day","a few times a week","a few times a month"} BP range:  BP medication side effects:  {Blank single:19197::"yes","no"} Medication compliance: {Blank single:19197::"excellent compliance","good compliance","fair compliance","poor compliance"} Previous BP meds:{Blank NATFTDDU:20254::"YHCW","CBJSEGBTDV","VOHYWVPXTG/GYIRSWNIOE","VOJJKKXF","GHWEXHBZJI","RCVELFYBOF/BPZW","CHENIDPOEU (bystolic)","carvedilol","chlorthalidone","clonidine","diltiazem","exforge HCT","HCTZ","irbesartan (avapro)","labetalol","lisinopril","lisinopril-HCTZ","losartan (cozaar)","methyldopa","nifedipine","olmesartan (benicar)","olmesartan-HCTZ","quinapril","ramipril","spironalactone","tekturna","valsartan","valsartan-HCTZ","verapamil"} Aspirin: {Blank single:19197::"yes","no"} Recurrent headaches: {Blank single:19197::"yes","no"} Visual changes: {Blank single:19197::"yes","no"} Palpitations: {Blank single:19197::"yes","no"} Dyspnea: {Blank single:19197::"yes","no"} Chest pain: {Blank single:19197::"yes","no"} Lower extremity edema: {Blank single:19197::"yes","no"} Dizzy/lightheaded: {Blank single:19197::"yes","no"}  GERD GERD control status: {Blank single:19197::"controlled","uncontrolled","better","worse","exacerbated","stable"}Satisfied with current treatment? {Blank single:19197::"yes","no"} Heartburn frequency:  Medication side effects:  {Blank single:19197::"yes","no"}  Medication compliance: {Blank multiple:19196::"better","worse","stable","fluctuating"} Dysphagia: {Blank single:19197::"yes","no"} Odynophagia:  {Blank single:19197::"yes","no"} Hematemesis: {Blank single:19197::"yes","no"} Blood in stool: {Blank single:19197::"yes","no"} EGD: {Blank single:19197::"yes","no"}    Relevant past medical, surgical, family and social history reviewed and updated as indicated. Interim medical history since our last visit reviewed. Allergies and medications reviewed and updated.  Review of Systems  Per HPI unless specifically indicated above     Objective:    There were no vitals taken for this visit.  Wt Readings from Last 3 Encounters:  05/18/16 165 lb (74.8 kg)  04/10/16 158 lb (71.7 kg)  02/18/16 162 lb 12.8 oz (73.8 kg)    Physical Exam  Results for orders placed or performed in visit on 01/08/16  Hepatitis, Acute  Result Value Ref Range   Hep A IgM Negative Negative   Hepatitis B Surface Ag Negative Negative   Hep B C IgM Negative Negative   Hep C Virus Ab <0.1 0.0 - 0.9 s/co ratio  Basic metabolic panel  Result Value Ref Range   Glucose 79 65 - 99 mg/dL   BUN 18 6 - 24 mg/dL   Creatinine, Ser 0.86 0.76 - 1.27 mg/dL   GFR calc non Af Amer 99 >59 mL/min/1.73   GFR calc Af Amer 114 >59 mL/min/1.73   BUN/Creatinine Ratio 21 (H) 9 - 20   Sodium 143 134 - 144 mmol/L   Potassium 4.4 3.5 - 5.2 mmol/L   Chloride 107 (H) 96 - 106 mmol/L   CO2 23 18 - 29 mmol/L   Calcium 9.4 8.7 - 10.2 mg/dL  Hepatitis B Surface AntiBODY  Result Value Ref Range   Hep B Surface Ab, Qual Non Reactive       Assessment & Plan:   Problem List Items Addressed This Visit    None       Follow up plan: No Follow-up on file.

## 2016-08-19 NOTE — Progress Notes (Signed)
BP 122/82 (BP Location: Left Arm, Patient Position: Sitting, Cuff Size: Normal)   Pulse 73   Temp 98.7 F (37.1 C)   Wt 158 lb 4 oz (71.8 kg)   SpO2 97%   BMI 22.71 kg/m    Subjective:    Patient ID: Daniel Mcneil, male    DOB: May 01, 1960, 56 y.o.   MRN: [id number]  HPI: Daniel Mcneil is a 56 y.o. male presenting on 08/19/2016 for comprehensive medical examination. Current medical complaints include:  HYPERTENSION Hypertension status: controlled  Satisfied with current treatment? yes Duration of hypertension: chronic BP monitoring frequency:  not checking BP medication side effects:  no Medication compliance: excellent compliance Previous BP meds:lisinopril Aspirin: no Recurrent headaches: no Visual changes: no Palpitations: no Dyspnea: no Chest pain: no Lower extremity edema: no Dizzy/lightheaded: no  GERD GERD control status: controlled  Satisfied with current treatment? yes Heartburn frequency: rarely on the medicine Medication side effects: no  Medication compliance: excellent Dysphagia: yes- has a history of choking as a child Odynophagia:  no Hematemesis: no Blood in stool: no EGD: yes  Has been down because he has had several deaths in the family, including a suicide. He's hanging in there.   Has been bit by ticks several times in the last couple of months.   He currently lives with: wife Interim Problems from his last visit: no  Depression Screen done today and results listed below:  Depression screen [facility] 2/9 08/19/2016 07/08/2015  Decreased Interest 3 1  Down, Depressed, Hopeless 1 1  PHQ - 2 Score 4 2  Altered sleeping 1 1  Tired, decreased energy 3 1  Change in appetite 1 0  Feeling bad or failure about yourself  0 1  Trouble concentrating 0 0  Moving slowly or fidgety/restless 1 1  Suicidal thoughts 0 0  PHQ-9 Score 10 6    Past Medical History:  Past Medical History:  Diagnosis Date  . Chronic kidney disease, stage I   . Dysphagia   .  GERD (gastroesophageal reflux disease)   . Herniated disc, cervical   . Hypertension   . Hypertensive nephropathy   . Lumbar herniated disc   . Tobacco use disorder   . Urinary hesitancy     Surgical History:  Past Surgical History:  Procedure Laterality Date  . BIOPSY [name] 04/10/2016   Procedure: BIOPSY;  Surgeon: [name], MD;  Location: [place];  Service: Endoscopy;  Laterality: [name];  . COLONOSCOPY WITH PROPOFOL [name] 04/10/2016   Procedure: COLONOSCOPY WITH PROPOFOL;  Surgeon: [name], MD;  Location: [place];  Service: Endoscopy;  Laterality: [name];  requests - not first  . HERNIA REPAIR      Medications:  Current Outpatient Prescriptions on File Prior to Visit  Medication Sig  . Aspirin-Salicylamide-Caffeine (BC HEADACHE POWDER PO) Take by mouth as needed.  . Multiple Vitamins-Minerals (EQ MULTIVITAMINS ADULT GUMMY PO) Take by mouth daily.  . naproxen sodium (ANAPROX) 220 MG tablet Take 220 mg by mouth as needed.   No current facility-administered medications on file prior to visit.     Allergies:  Allergies  Allergen Reactions  . Flomax [Tamsulosin]     dizziness    Social History:  Social History   Social History  . Marital status: Married    Spouse name: [name]  . Number of children: [name]  . Years of education: [name]   Occupational History  . Not on file.   Social History Main Topics  .  Smoking status: Current Every Day Smoker    Packs/day: 1.50    Years: 36.00    Types: Cigarettes  . Smokeless tobacco: Never Used     Comment: since age 89.  Marland Kitchen Alcohol use 9.0 oz/week    15 Cans of beer per week     Comment:    . Drug use: No  . Sexual activity: Yes    Birth control/ protection: None   Other Topics Concern  . Not on file   Social History Narrative  . No narrative on file   History  Smoking Status  . Current Every Day Smoker  . Packs/day: 1.50  . Years: 36.00  . Types: Cigarettes  Smokeless Tobacco  . Never Used     Comment: since age 45.   History  Alcohol Use  . 9.0 oz/week  . 15 Cans of beer per week    Comment:      Family History:  Family History  Problem Relation Age of Onset  . Stroke Mother   . Hypertension Mother   . Cancer Father   . Hypertension Father   . Diabetes Father   . Heart disease Father   . Cancer Maternal Grandfather   . Hypertension Sister     Past medical history, surgical history, medications, allergies, family history and social history reviewed with patient today and changes made to appropriate areas of the chart.   Review of Systems  Constitutional: Positive for malaise/fatigue. Negative for chills, diaphoresis, fever and weight loss.  HENT: Positive for congestion. Negative for ear discharge, ear pain, hearing loss, nosebleeds, sinus pain, sore throat and tinnitus.        Choking feeling at night   Eyes: Negative.   Respiratory: Negative.  Negative for stridor.   Cardiovascular: Negative.   Gastrointestinal: Negative.   Genitourinary: Negative.   Musculoskeletal: Positive for myalgias and neck pain. Negative for back pain, falls and joint pain.  Skin: Negative.   Neurological: Negative.  Negative for weakness.  Endo/Heme/Allergies: Positive for environmental allergies. Negative for polydipsia. Bruises/bleeds easily.  Psychiatric/Behavioral: Positive for depression. Negative for hallucinations, memory loss, substance abuse and suicidal ideas. The patient is not nervous/anxious and does not have insomnia.     All other ROS negative except what is listed above and in the HPI.      Objective:    BP 122/82 (BP Location: Left Arm, Patient Position: Sitting, Cuff Size: Normal)   Pulse 73   Temp 98.7 F (37.1 C)   Wt 158 lb 4 oz (71.8 kg)   SpO2 97%   BMI 22.71 kg/m   Wt Readings from Last 3 Encounters:  08/19/16 158 lb 4 oz (71.8 kg)  05/18/16 165 lb (74.8 kg)  04/10/16 158 lb (71.7 kg)    Physical Exam  Constitutional: He is oriented to person,  place, and time. He appears well-developed and well-nourished. No distress.  HENT:  Head: Normocephalic and atraumatic.  Right Ear: Hearing and external ear normal.  Left Ear: Hearing and external ear normal.  Nose: Nose normal.  Mouth/Throat: Oropharynx is clear and moist. No oropharyngeal exudate.  Eyes: Pupils are equal, round, and reactive to light. Conjunctivae, EOM and lids are normal. Right eye exhibits no discharge. Left eye exhibits no discharge. No scleral icterus.  Neck: Normal range of motion. Neck supple. No JVD present. No tracheal deviation present. No thyromegaly present.  Cardiovascular: Normal rate, regular rhythm, normal heart sounds and intact distal pulses.  Exam reveals no gallop and  no friction rub.   No murmur heard. Pulmonary/Chest: Effort normal and breath sounds normal. No stridor. No respiratory distress. He has no wheezes. He has no rales. He exhibits no tenderness.  Abdominal: Soft. Bowel sounds are normal. He exhibits no distension and no mass. There is no tenderness. There is no rebound and no guarding. Hernia confirmed negative in the right inguinal area and confirmed negative in the left inguinal area.  Genitourinary: Rectum normal, prostate normal, testes normal and penis normal. Prostate is not enlarged and not tender. Cremasteric reflex is present. Right testis shows no mass, no swelling and no tenderness. Right testis is descended. Cremasteric reflex is not absent on the right side. Left testis shows no mass, no swelling and no tenderness. Left testis is descended. Cremasteric reflex is not absent on the left side. No penile tenderness.  Musculoskeletal: Normal range of motion. He exhibits no edema, tenderness or deformity.  Lymphadenopathy:    He has no cervical adenopathy.  Neurological: He is alert and oriented to person, place, and time. He has normal reflexes. He displays normal reflexes. No cranial nerve deficit. He exhibits normal muscle tone.  Coordination normal.  Skin: Skin is warm, dry and intact. No rash noted. He is not diaphoretic. No erythema. No pallor.  Psychiatric: He has a normal mood and affect. His speech is normal and behavior is normal. Judgment and thought content normal. Cognition and memory are normal.  Nursing note and vitals reviewed.   Results for orders placed or performed in visit on 01/08/16  Hepatitis, Acute  Result Value Ref Range   Hep A IgM Negative Negative   Hepatitis B Surface Ag Negative Negative   Hep B C IgM Negative Negative   Hep C Virus Ab <0.1 0.0 - 0.9 s/co ratio  Basic metabolic panel  Result Value Ref Range   Glucose 79 65 - 99 mg/dL   BUN 18 6 - 24 mg/dL   Creatinine, Ser 0.86 0.76 - 1.27 mg/dL   GFR calc non Af Amer 99 >59 mL/min/1.73   GFR calc Af Amer 114 >59 mL/min/1.73   BUN/Creatinine Ratio 21 (H) 9 - 20   Sodium 143 134 - 144 mmol/L   Potassium 4.4 3.5 - 5.2 mmol/L   Chloride 107 (H) 96 - 106 mmol/L   CO2 23 18 - 29 mmol/L   Calcium 9.4 8.7 - 10.2 mg/dL  Hepatitis B Surface AntiBODY  Result Value Ref Range   Hep B Surface Ab, Qual Non Reactive       Assessment & Plan:   Problem List Items Addressed This Visit      Digestive   GERD (gastroesophageal reflux disease)    Under good control. Continue current regimen. Continue to monitor. Call with any concerns.       Relevant Medications   omeprazole (PRILOSEC) 20 MG capsule   Other Relevant Orders   CBC with Differential/Platelet   Comprehensive metabolic panel     Genitourinary   Hypertensive nephropathy    Under good control. Continue current regimen. Continue to monitor. Refills given today. Call with any concerns.       Relevant Orders   Comprehensive metabolic panel   Microalbumin, Urine Waived    Other Visit Diagnoses    Routine general medical examination at a health care facility    -  Primary   Vaccines up to date. Screening labs checked today. Colonoscopy up to date. Continue diet and exercise.  Call with any concerns.    Relevant  Orders   CBC with Differential/Platelet   Comprehensive metabolic panel   Lipid Panel w/o Chol/HDL Ratio   Microalbumin, Urine Waived   PSA   TSH   UA/M w/rflx Culture, Routine   Screening for cholesterol level       Labs drawn today. Await results.    Relevant Orders   Lipid Panel w/o Chol/HDL Ratio   Screening for prostate cancer       Labs drawn today. Await results.    Relevant Orders   PSA   Tick bite, initial encounter       Feeling fatigued- will check for tick-bourne illnesses, call with any concerns. Await results.    Relevant Orders   Lyme Ab/Western Blot Reflex   Rocky mtn spotted fvr abs pnl(IgG+IgM)   Ehrlichia Antibody Panel   Babesia microti Antibody Panel       Discussed aspirin prophylaxis for myocardial infarction prevention and decision was it was not indicated  LABORATORY TESTING:  Health maintenance labs ordered today as discussed above.   The natural history of prostate cancer and ongoing controversy regarding screening and potential treatment outcomes of prostate cancer has been discussed with the patient. The meaning of a false positive PSA and a false negative PSA has been discussed. He indicates understanding of the limitations of this screening test and wishes to proceed with screening PSA testing.   IMMUNIZATIONS:   - Tdap: Tetanus vaccination status reviewed: last tetanus booster within 10 years. - Influenza: Postponed to flu season - Pneumovax: Up to date  SCREENING: - Colonoscopy: Up to date  Discussed with patient purpose of the colonoscopy is to detect colon cancer at curable precancerous or early stages   PATIENT COUNSELING:    Sexuality: Discussed sexually transmitted diseases, partner selection, use of condoms, avoidance of unintended pregnancy  and contraceptive alternatives.   Advised to avoid cigarette smoking.  I discussed with the patient that most people either abstain from alcohol or drink  within safe limits (<=14/week and <=4 drinks/occasion for males, <=7/weeks and <= 3 drinks/occasion for females) and that the risk for alcohol disorders and other health effects rises proportionally with the number of drinks per week and how often a drinker exceeds daily limits.  Discussed cessation/primary prevention of drug use and availability of treatment for abuse.   Diet: Encouraged to adjust caloric intake to maintain  or achieve ideal body weight, to reduce intake of dietary saturated fat and total fat, to limit sodium intake by avoiding high sodium foods and not adding table salt, and to maintain adequate dietary potassium and calcium preferably from fresh fruits, vegetables, and low-fat dairy products.    stressed the importance of regular exercise  Injury prevention: Discussed safety belts, safety helmets, smoke detector, smoking near bedding or upholstery.   Dental health: Discussed importance of regular tooth brushing, flossing, and dental visits.   Follow up plan: NEXT PREVENTATIVE PHYSICAL DUE IN 1 YEAR. Return in about 6 months (around 02/19/2017) for for follow up.

## 2016-08-19 NOTE — Assessment & Plan Note (Signed)
Under good control. Continue current regimen. Continue to monitor. Call with any concerns. 

## 2016-08-20 LAB — UA/M W/RFLX CULTURE, ROUTINE
BILIRUBIN UA: NEGATIVE
Glucose, UA: NEGATIVE
KETONES UA: NEGATIVE
LEUKOCYTES UA: NEGATIVE
NITRITE UA: NEGATIVE
SPEC GRAV UA: 1.025 (ref 1.005–1.030)
Urobilinogen, Ur: 0.2 mg/dL (ref 0.2–1.0)
pH, UA: 5 (ref 5.0–7.5)

## 2016-08-20 LAB — MICROSCOPIC EXAMINATION
Bacteria, UA: NONE SEEN
CASTS: NONE SEEN /LPF
CRYSTAL TYPE: NONE SEEN
CRYSTALS: NONE SEEN
Cast Type: NONE SEEN
Renal Epithel, UA: NONE SEEN /hpf
TRICHOMONAS UA: NONE SEEN
WBC UA: NONE SEEN /HPF (ref 0–?)
Yeast, UA: NONE SEEN

## 2016-08-20 LAB — MICROALBUMIN, URINE WAIVED
Creatinine, Urine Waived: 200 mg/dL (ref 10–300)
Microalb, Ur Waived: 80 mg/L — ABNORMAL HIGH (ref 0–19)

## 2016-08-22 LAB — COMPREHENSIVE METABOLIC PANEL
A/G RATIO: 1.8 (ref 1.2–2.2)
ALT: 37 IU/L (ref 0–44)
AST: 36 IU/L (ref 0–40)
Albumin: 4.6 g/dL (ref 3.5–5.5)
Alkaline Phosphatase: 78 IU/L (ref 39–117)
BUN/Creatinine Ratio: 22 — ABNORMAL HIGH (ref 9–20)
BUN: 23 mg/dL (ref 6–24)
CALCIUM: 9.4 mg/dL (ref 8.7–10.2)
CHLORIDE: 107 mmol/L — AB (ref 96–106)
CO2: 21 mmol/L (ref 20–29)
Creatinine, Ser: 1.03 mg/dL (ref 0.76–1.27)
GFR calc Af Amer: 95 mL/min/{1.73_m2} (ref >59)
GFR calc non Af Amer: 83 mL/min/{1.73_m2} (ref >59)
GLUCOSE: 79 mg/dL (ref 65–99)
Globulin, Total: 2.6 g/dL (ref 1.5–4.5)
POTASSIUM: 4.6 mmol/L (ref 3.5–5.2)
Sodium: 142 mmol/L (ref 134–144)
Total Protein: 7.2 g/dL (ref 6.0–8.5)

## 2016-08-22 LAB — EHRLICHIA ANTIBODY PANEL
E. Chaffeensis (HME) IgM Titer: NEGATIVE
E.Chaffeensis (HME) IgG: NEGATIVE
HGE IGG TITER: NEGATIVE
HGE IgM Titer: NEGATIVE

## 2016-08-22 LAB — BABESIA MICROTI ANTIBODY PANEL

## 2016-08-22 LAB — CBC WITH DIFFERENTIAL/PLATELET
Basophils Absolute: 0 10*3/uL (ref 0.0–0.2)
Basos: 0 %
EOS (ABSOLUTE): 0.1 10*3/uL (ref 0.0–0.4)
EOS: 2 %
Hematocrit: 42 % (ref 37.5–51.0)
Hemoglobin: 15.2 g/dL (ref 13.0–17.7)
IMMATURE GRANS (ABS): 0.1 10*3/uL (ref 0.0–0.1)
IMMATURE GRANULOCYTES: 1 %
Lymphocytes Absolute: 1.2 10*3/uL (ref 0.7–3.1)
Lymphs: 15 %
MCH: 30.5 pg (ref 26.6–33.0)
MCHC: 36.2 g/dL — ABNORMAL HIGH (ref 31.5–35.7)
MCV: 84 fL (ref 79–97)
MONOS ABS: 0.8 10*3/uL (ref 0.1–0.9)
Monocytes: 10 %
Neutrophils Absolute: 5.9 10*3/uL (ref 1.4–7.0)
Neutrophils: 72 %
PLATELETS: 257 10*3/uL (ref 150–379)
RBC: 4.98 x10E6/uL (ref 4.14–5.80)
RDW: 14.3 % (ref 12.3–15.4)
WBC: 8.1 10*3/uL (ref 3.4–10.8)

## 2016-08-22 LAB — LIPID PANEL W/O CHOL/HDL RATIO
CHOLESTEROL TOTAL: 206 mg/dL — AB (ref 100–199)
HDL: 35 mg/dL — ABNORMAL LOW (ref >39)
LDL Calculated: 155 mg/dL — ABNORMAL HIGH (ref 0–99)
TRIGLYCERIDES: 79 mg/dL (ref 0–149)
VLDL CHOLESTEROL CAL: 16 mg/dL (ref 5–40)

## 2016-08-22 LAB — LYME AB/WESTERN BLOT REFLEX: Lyme IgG/IgM Ab: <0.91 {ISR} (ref 0.00–0.90)

## 2016-08-22 LAB — ROCKY MTN SPOTTED FVR ABS PNL(IGG+IGM)
RMSF IGG: NEGATIVE
RMSF IgM: 0.41 index (ref 0.00–0.89)

## 2016-08-22 LAB — TSH: TSH: 1.15 u[IU]/mL (ref 0.450–4.500)

## 2016-08-22 LAB — PSA: Prostate Specific Ag, Serum: 0.3 ng/mL (ref 0.0–4.0)

## 2016-08-24 ENCOUNTER — Encounter: Payer: Self-pay | Admitting: Family Medicine

## 2017-02-23 ENCOUNTER — Ambulatory Visit: Payer: BC Managed Care – PPO | Admitting: Family Medicine

## 2017-02-23 ENCOUNTER — Encounter: Payer: Self-pay | Admitting: Family Medicine

## 2017-02-23 VITALS — BP 138/88 | HR 73 | Temp 98.9°F | Wt 166.4 lb

## 2017-02-23 DIAGNOSIS — Z23 Encounter for immunization: Secondary | ICD-10-CM

## 2017-02-23 DIAGNOSIS — F17200 Nicotine dependence, unspecified, uncomplicated: Secondary | ICD-10-CM | POA: Diagnosis not present

## 2017-02-23 DIAGNOSIS — I129 Hypertensive chronic kidney disease with stage 1 through stage 4 chronic kidney disease, or unspecified chronic kidney disease: Secondary | ICD-10-CM | POA: Diagnosis not present

## 2017-02-23 DIAGNOSIS — K219 Gastro-esophageal reflux disease without esophagitis: Secondary | ICD-10-CM | POA: Diagnosis not present

## 2017-02-23 MED ORDER — VARENICLINE TARTRATE 1 MG PO TABS: 1.0000 mg | ORAL_TABLET | Freq: Two times a day (BID) | ORAL | 3 refills | Status: DC

## 2017-02-23 MED ORDER — LISINOPRIL 10 MG PO TABS: 10.0000 mg | ORAL_TABLET | Freq: Every day | ORAL | 1 refills | Status: DC

## 2017-02-23 MED ORDER — VARENICLINE TARTRATE 0.5 MG X 11 & 1 MG X 42 PO MISC: ORAL | 0 refills | Status: DC

## 2017-02-23 MED ORDER — DEXLANSOPRAZOLE 60 MG PO CPDR
60.0000 mg | DELAYED_RELEASE_CAPSULE | Freq: Every day | ORAL | 6 refills | Status: DC
Start: 2017-02-23 — End: 2017-12-13

## 2017-02-23 NOTE — Patient Instructions (Addendum)
Influenza (Flu) Vaccine (Inactivated or Recombinant): What You Need to Know 1. Why get vaccinated? Influenza ("flu") is a contagious disease that spreads around the [place] every year, usually between October and May. Flu is caused by influenza viruses, and is spread mainly by coughing, sneezing, and close contact. Anyone can get flu. Flu strikes suddenly and can last several days. Symptoms vary by age, but can include:  fever/chills  sore throat  muscle aches  fatigue  cough  headache  runny or stuffy nose  Flu can also lead to pneumonia and blood infections, and cause diarrhea and seizures in children. If you have a medical condition, such as heart or lung disease, flu can make it worse. Flu is more dangerous for some people. Infants and young children, people 55 years of age and older, pregnant women, and people with certain health conditions or a weakened immune system are at greatest risk. Each year thousands of people in the [place] die from flu, and many more are hospitalized. Flu vaccine can:  keep you from getting flu,  make flu less severe if you do get it, and  keep you from spreading flu to your family and other people. 2. Inactivated and recombinant flu vaccines A dose of flu vaccine is recommended every flu season. Children 6 months through 55 years of age may need two doses during the same flu season. Everyone else needs only one dose each flu season. Some inactivated flu vaccines contain a very small amount of a mercury-based preservative called thimerosal. Studies have not shown thimerosal in vaccines to be harmful, but flu vaccines that do not contain thimerosal are available. There is no live flu virus in flu shots. They cannot cause the flu. There are many flu viruses, and they are always changing. Each year a new flu vaccine is made to protect against three or four viruses that are likely to cause disease in the upcoming flu season. But even when the  vaccine doesn't exactly match these viruses, it may still provide some protection. Flu vaccine cannot prevent:  flu that is caused by a virus not covered by the vaccine, or  illnesses that look like flu but are not.  It takes about 2 weeks for protection to develop after vaccination, and protection lasts through the flu season. 3. Some people should not get this vaccine Tell the person who is giving you the vaccine:  If you have any severe, life-threatening allergies. If you ever had a life-threatening allergic reaction after a dose of flu vaccine, or have a severe allergy to any part of this vaccine, you may be advised not to get vaccinated. Most, but not all, types of flu vaccine contain a small amount of egg protein.  If you ever had Guillain-Barr Syndrome (also called GBS). Some people with a history of GBS should not get this vaccine. This should be discussed with your doctor.  If you are not feeling well. It is usually okay to get flu vaccine when you have a mild illness, but you might be asked to come back when you feel better.  4. Risks of a vaccine reaction With any medicine, including vaccines, there is a chance of reactions. These are usually mild and go away on their own, but serious reactions are also possible. Most people who get a flu shot do not have any problems with it. Minor problems following a flu shot include:  soreness, redness, or swelling where the shot was given  hoarseness  sore,  red or itchy eyes  cough  fever  aches  headache  itching  fatigue  If these problems occur, they usually begin soon after the shot and last 1 or 2 days. More serious problems following a flu shot can include the following:  There may be a small increased risk of Guillain-Barre Syndrome (GBS) after inactivated flu vaccine. This risk has been estimated at 1 or 2 additional cases per million people vaccinated. This is much lower than the risk of severe complications from  flu, which can be prevented by flu vaccine.  Young children who get the flu shot along with pneumococcal vaccine (PCV13) and/or DTaP vaccine at the same time might be slightly more likely to have a seizure caused by fever. Ask your doctor for more information. Tell your doctor if a child who is getting flu vaccine has ever had a seizure.  Problems that could happen after any injected vaccine:  People sometimes faint after a medical procedure, including vaccination. Sitting or lying down for about 15 minutes can help prevent fainting, and injuries caused by a fall. Tell your doctor if you feel dizzy, or have vision changes or ringing in the ears.  Some people get severe pain in the shoulder and have difficulty moving the arm where a shot was given. This happens very rarely.  Any medication can cause a severe allergic reaction. Such reactions from a vaccine are very rare, estimated at about 1 in a million doses, and would happen within a few minutes to a few hours after the vaccination. As with any medicine, there is a very remote chance of a vaccine causing a serious injury or death. The safety of vaccines is always being monitored. For more information, visit: http://www.aguilar.org/ 5. What if there is a serious reaction? What should I look for? Look for anything that concerns you, such as signs of a severe allergic reaction, very high fever, or unusual behavior. Signs of a severe allergic reaction can include hives, swelling of the face and throat, difficulty breathing, a fast heartbeat, dizziness, and weakness. These would start a few minutes to a few hours after the vaccination. What should I do?  If you think it is a severe allergic reaction or other emergency that can't wait, call 9-1-1 and get the person to the nearest hospital. Otherwise, call your doctor.  Reactions should be reported to the Vaccine Adverse Event Reporting System (VAERS). Your doctor should file this report, or you  can do it yourself through the VAERS web site at www.vaers.SamedayNews.es, or by calling 6094730752. ? VAERS does not give medical advice. 6. The National Vaccine Injury Compensation Program The [employer] Vaccine Injury Compensation Program (VICP) is a federal program that was created to compensate people who may have been injured by certain vaccines. Persons who believe they may have been injured by a vaccine can learn about the program and about filing a claim by calling 458-267-6070 or visiting the [place] website at GoldCloset.com.ee. There is a time limit to file a claim for compensation. 7. How can I learn more?  Ask your healthcare provider. He or she can give you the vaccine package insert or suggest other sources of information.  Call your local or state health department.  Contact the Centers for Disease Control and Prevention (CDC): ? Call (540)164-9661 (1-800-CDC-INFO) or ? Visit CDC's website at https://gibson.com/ Vaccine Information Statement, Inactivated Influenza Vaccine (09/01/2013) This information is not intended to replace advice given to you by your health care provider. Make sure  you discuss any questions you have with your health care provider. Document Released: 11/06/2005 Document Revised: 10/03/2015 Document Reviewed: 10/03/2015 Elsevier Interactive Patient Education  2017 Elsevier Inc. Tdap Vaccine (Tetanus, Diphtheria and Pertussis): What You Need to Know 1. Why get vaccinated? Tetanus, diphtheria and pertussis are very serious diseases. Tdap vaccine can protect us from these diseases. And, Tdap vaccine given to pregnant women can protect newborn babies against pertussis. TETANUS (Lockjaw) is rare in the [place] today. It causes painful muscle tightening and stiffness, usually all over the body.  It can lead to tightening of muscles in the head and neck so you can't open your mouth, swallow, or sometimes even breathe. Tetanus kills about 1 out of 10  people who are infected even after receiving the best medical care.  DIPHTHERIA is also rare in the [place] today. It can cause a thick coating to form in the back of the throat.  It can lead to breathing problems, heart failure, paralysis, and death.  PERTUSSIS (Whooping Cough) causes severe coughing spells, which can cause difficulty breathing, vomiting and disturbed sleep.  It can also lead to weight loss, incontinence, and rib fractures. Up to 2 in 100 adolescents and 5 in 100 adults with pertussis are hospitalized or have complications, which could include pneumonia or death.  These diseases are caused by bacteria. Diphtheria and pertussis are spread from person to person through secretions from coughing or sneezing. Tetanus enters the body through cuts, scratches, or wounds. Before vaccines, as many as 200,000 cases of diphtheria, 200,000 cases of pertussis, and hundreds of cases of tetanus, were reported in the [place] each year. Since vaccination began, reports of cases for tetanus and diphtheria have dropped by about 99% and for pertussis by about 80%. 2. Tdap vaccine Tdap vaccine can protect adolescents and adults from tetanus, diphtheria, and pertussis. One dose of Tdap is routinely given at age 11 or 12. People who did not get Tdap at that age should get it as soon as possible. Tdap is especially important for healthcare professionals and anyone having close contact with a baby younger than 12 months. Pregnant women should get a dose of Tdap during every pregnancy, to protect the newborn from pertussis. Infants are most at risk for severe, life-threatening complications from pertussis. Another vaccine, called Td, protects against tetanus and diphtheria, but not pertussis. A Td booster should be given every 10 years. Tdap may be given as one of these boosters if you have never gotten Tdap before. Tdap may also be given after a severe cut or burn to prevent tetanus  infection. Your doctor or the person giving you the vaccine can give you more information. Tdap may safely be given at the same time as other vaccines. 3. Some people should not get this vaccine  A person who has ever had a life-threatening allergic reaction after a previous dose of any diphtheria, tetanus or pertussis containing vaccine, OR has a severe allergy to any part of this vaccine, should not get Tdap vaccine. Tell the person giving the vaccine about any severe allergies.  Anyone who had coma or long repeated seizures within 7 days after a childhood dose of DTP or DTaP, or a previous dose of Tdap, should not get Tdap, unless a cause other than the vaccine was found. They can still get Td.  Talk to your doctor if you: ? have seizures or another nervous system problem, ? had severe pain or swelling after any vaccine containing diphtheria,   tetanus or pertussis, ? ever had a condition called Guillain-Barr Syndrome (GBS), ? aren't feeling well on the day the shot is scheduled. 4. Risks With any medicine, including vaccines, there is a chance of side effects. These are usually mild and go away on their own. Serious reactions are also possible but are rare. Most people who get Tdap vaccine do not have any problems with it. Mild problems following Tdap: (Did not interfere with activities)  Pain where the shot was given (about 3 in 4 adolescents or 2 in 3 adults)  Redness or swelling where the shot was given (about 1 person in 5)  Mild fever of at least 100.4F (up to about 1 in 25 adolescents or 1 in 100 adults)  Headache (about 3 or 4 people in 10)  Tiredness (about 1 person in 3 or 4)  Nausea, vomiting, diarrhea, stomach ache (up to 1 in 4 adolescents or 1 in 10 adults)  Chills, sore joints (about 1 person in 10)  Body aches (about 1 person in 3 or 4)  Rash, swollen glands (uncommon)  Moderate problems following Tdap: (Interfered with activities, but did not require medical  attention)  Pain where the shot was given (up to 1 in 5 or 6)  Redness or swelling where the shot was given (up to about 1 in 16 adolescents or 1 in 12 adults)  Fever over 102F (about 1 in 100 adolescents or 1 in 250 adults)  Headache (about 1 in 7 adolescents or 1 in 10 adults)  Nausea, vomiting, diarrhea, stomach ache (up to 1 or 3 people in 100)  Swelling of the entire arm where the shot was given (up to about 1 in 500).  Severe problems following Tdap: (Unable to perform usual activities; required medical attention)  Swelling, severe pain, bleeding and redness in the arm where the shot was given (rare).  Problems that could happen after any vaccine:  People sometimes faint after a medical procedure, including vaccination. Sitting or lying down for about 15 minutes can help prevent fainting, and injuries caused by a fall. Tell your doctor if you feel dizzy, or have vision changes or ringing in the ears.  Some people get severe pain in the shoulder and have difficulty moving the arm where a shot was given. This happens very rarely.  Any medication can cause a severe allergic reaction. Such reactions from a vaccine are very rare, estimated at fewer than 1 in a million doses, and would happen within a few minutes to a few hours after the vaccination. As with any medicine, there is a very remote chance of a vaccine causing a serious injury or death. The safety of vaccines is always being monitored. For more information, visit: www.cdc.gov/vaccinesafety/ 5. What if there is a serious problem? What should I look for? Look for anything that concerns you, such as signs of a severe allergic reaction, very high fever, or unusual behavior. Signs of a severe allergic reaction can include hives, swelling of the face and throat, difficulty breathing, a fast heartbeat, dizziness, and weakness. These would usually start a few minutes to a few hours after the vaccination. What should I do?  If  you think it is a severe allergic reaction or other emergency that can't wait, call 9-1-1 or get the person to the nearest hospital. Otherwise, call your doctor.  Afterward, the reaction should be reported to the Vaccine Adverse Event Reporting System (VAERS). Your doctor might file this report, or you can do   it yourself through the VAERS web site at www.vaers.hhs.gov, or by calling 1-800-822-7967. ? VAERS does not give medical advice. 6. The National Vaccine Injury Compensation Program The National Vaccine Injury Compensation Program (VICP) is a federal program that was created to compensate people who may have been injured by certain vaccines. Persons who believe they may have been injured by a vaccine can learn about the program and about filing a claim by calling 1-800-338-2382 or visiting the VICP website at www.hrsa.gov/vaccinecompensation. There is a time limit to file a claim for compensation. 7. How can I learn more?  Ask your doctor. He or she can give you the vaccine package insert or suggest other sources of information.  Call your local or state health department.  Contact the Centers for Disease Control and Prevention (CDC): ? Call 1-800-232-4636 (1-800-CDC-INFO) or ? Visit CDC's website at www.cdc.gov/vaccines CDC Tdap Vaccine VIS (03/21/13) This information is not intended to replace advice given to you by your health care provider. Make sure you discuss any questions you have with your health care provider. Document Released: 07/14/2011 Document Revised: 10/03/2015 Document Reviewed: 10/03/2015 Elsevier Interactive Patient Education  2017 Elsevier Inc.  

## 2017-02-23 NOTE — Assessment & Plan Note (Signed)
Will start chantix. Recheck 1 month. Call with any concerns.

## 2017-02-23 NOTE — Progress Notes (Signed)
BP 138/88 (BP Location: Left Arm, Cuff Size: Normal)   Pulse 73   Temp 98.9 F (37.2 C)   Wt 166 lb 6 oz (75.5 kg)   SpO2 96%   BMI 23.87 kg/m    Subjective:    Patient ID: Daniel Mcneil, male    DOB: 08-24-1960, 57 y.o.   MRN: [id number]  HPI: Adarius Tigges is a 57 y.o. male  Chief Complaint  Patient presents with  . Hypertension   Has been feeling tired for the past 6 months  SMOKING CESSATION Smoking Status: current every day smoker Smoking Amount:  12 packs a week- a bit over 1.5ppd Smoking Onset: 57yo Smoking Quit Date: Not set Smoking triggers: driving, after eating, hanging out, drinking Type of tobacco use: cigarettes Children in the house: no Other household members who smoke: no Treatments attempted: patches  Pneumovax: up to date  HYPERTENSION- visited his friend on hospice yesterday, has been under extra stress Hypertension status: exacerbated  Satisfied with current treatment? yes Duration of hypertension: chronic BP monitoring frequency:  not checking BP medication side effects:  no Medication compliance: excellent compliance Previous BP meds: lisinopril Aspirin: no Recurrent headaches: no Visual changes: no Palpitations: no Dyspnea: no Chest pain: no Lower extremity edema: no Dizzy/lightheaded: no  GERD GERD control status: exacerbated  Satisfied with current treatment? no Heartburn frequency: every night Medication side effects: no  Medication compliance: excellent Previous GERD medications: omeprazole Antacid use frequency:  daily Duration: chronic Nature: burning and choking Location: throat Heartburn duration: hours  Alleviatiating factors:  nothing Aggravating factors: laying flat Dysphagia: no Odynophagia:  no Hematemesis: no Blood in stool: no EGD: no  Relevant past medical, surgical, family and social history reviewed and updated as indicated. Interim medical history since our last visit reviewed. Allergies and medications  reviewed and updated.  Review of Systems  Constitutional: Negative.   Respiratory: Negative.   Cardiovascular: Negative.   Gastrointestinal: Positive for nausea. Negative for abdominal distention, abdominal pain, anal bleeding, blood in stool, constipation, diarrhea, rectal pain and vomiting.  Psychiatric/Behavioral: Negative.     Per HPI unless specifically indicated above     Objective:    BP 138/88 (BP Location: Left Arm, Cuff Size: Normal)   Pulse 73   Temp 98.9 F (37.2 C)   Wt 166 lb 6 oz (75.5 kg)   SpO2 96%   BMI 23.87 kg/m   Wt Readings from Last 3 Encounters:  02/23/17 166 lb 6 oz (75.5 kg)  08/19/16 158 lb 4 oz (71.8 kg)  05/18/16 165 lb (74.8 kg)    Physical Exam  Constitutional: He is oriented to person, place, and time. He appears well-developed and well-nourished. No distress.  HENT:  Head: Normocephalic and atraumatic.  Right Ear: Hearing normal.  Left Ear: Hearing normal.  Nose: Nose normal.  Eyes: Conjunctivae and lids are normal. Right eye exhibits no discharge. Left eye exhibits no discharge. No scleral icterus.  Cardiovascular: Normal rate, regular rhythm, normal heart sounds and intact distal pulses. Exam reveals no gallop and no friction rub.  No murmur heard. Pulmonary/Chest: Effort normal and breath sounds normal. No respiratory distress. He has no wheezes. He has no rales. He exhibits no tenderness.  Musculoskeletal: Normal range of motion.  Neurological: He is alert and oriented to person, place, and time.  Skin: Skin is warm, dry and intact. No rash noted. He is not diaphoretic. No erythema. No pallor.  Psychiatric: He has a normal mood and affect. His speech is  normal and behavior is normal. Judgment and thought content normal. Cognition and memory are normal.  Nursing note and vitals reviewed.   Results for orders placed or performed in visit on 08/19/16  Microscopic Examination  Result Value Ref Range   WBC, UA None seen 0 - 5 /hpf    RBC, UA 0-2 0 - 2 /hpf   Epithelial Cells (non renal) 0-10 0 - 10 /hpf   Renal Epithel, UA None seen None seen /hpf   Casts None seen None seen /lpf   Cast Type None seen [name]   Crystals None seen [name]   Crystal Type None seen [name]   Mucus, UA Present (A) Not Estab.   Bacteria, UA None seen None seen/Few   Yeast, UA None seen None seen   Trichomonas, UA None seen None seen  CBC with Differential/Platelet  Result Value Ref Range   WBC 8.1 3.4 - 10.8 x10E3/uL   RBC 4.98 4.14 - 5.80 x10E6/uL   Hemoglobin 15.2 13.0 - 17.7 g/dL   Hematocrit 42.0 37.5 - 51.0 %   MCV 84 79 - 97 fL   MCH 30.5 26.6 - 33.0 pg   MCHC 36.2 (H) 31.5 - 35.7 g/dL   RDW 14.3 12.3 - 15.4 %   Platelets 257 150 - 379 x10E3/uL   Neutrophils 72 Not Estab. %   Lymphs 15 Not Estab. %   Monocytes 10 Not Estab. %   Eos 2 Not Estab. %   Basos 0 Not Estab. %   Neutrophils Absolute 5.9 1.4 - 7.0 x10E3/uL   Lymphocytes Absolute 1.2 0.7 - 3.1 x10E3/uL   Monocytes Absolute 0.8 0.1 - 0.9 x10E3/uL   EOS (ABSOLUTE) 0.1 0.0 - 0.4 x10E3/uL   Basophils Absolute 0.0 0.0 - 0.2 x10E3/uL   Immature Granulocytes 1 Not Estab. %   Immature Grans (Abs) 0.1 0.0 - 0.1 x10E3/uL  Comprehensive metabolic panel  Result Value Ref Range   Glucose 79 65 - 99 mg/dL   BUN 23 6 - 24 mg/dL   Creatinine, Ser 1.03 0.76 - 1.27 mg/dL   GFR calc non Af Amer 83 >59 mL/min/1.73   GFR calc Af Amer 95 >59 mL/min/1.73   BUN/Creatinine Ratio 22 (H) 9 - 20   Sodium 142 134 - 144 mmol/L   Potassium 4.6 3.5 - 5.2 mmol/L   Chloride 107 (H) 96 - 106 mmol/L   CO2 21 20 - 29 mmol/L   Calcium 9.4 8.7 - 10.2 mg/dL   Total Protein 7.2 6.0 - 8.5 g/dL   Albumin 4.6 3.5 - 5.5 g/dL   Globulin, Total 2.6 1.5 - 4.5 g/dL   Albumin/Globulin Ratio 1.8 1.2 - 2.2   Bilirubin Total <0.2 0.0 - 1.2 mg/dL   Alkaline Phosphatase 78 39 - 117 IU/L   AST 36 0 - 40 IU/L   ALT 37 0 - 44 IU/L  Lipid Panel w/o Chol/HDL Ratio  Result Value Ref Range   Cholesterol, Total 206 (H)  100 - 199 mg/dL   Triglycerides 79 0 - 149 mg/dL   HDL 35 (L) >39 mg/dL   VLDL Cholesterol Cal 16 5 - 40 mg/dL   LDL Calculated 155 (H) 0 - 99 mg/dL  Microalbumin, Urine Waived  Result Value Ref Range   Microalb, Ur Waived 80 (H) 0 - 19 mg/L   Creatinine, Urine Waived 200 10 - 300 mg/dL   Microalb/Creat Ratio 30-300 (H) <30 mg/g  PSA  Result Value Ref Range   Prostate Specific Ag,  Serum 0.3 0.0 - 4.0 ng/mL  TSH  Result Value Ref Range   TSH 1.150 0.450 - 4.500 uIU/mL  UA/M w/rflx Culture, Routine  Result Value Ref Range   Specific Gravity, UA 1.025 1.005 - 1.030   pH, UA 5.0 5.0 - 7.5   Color, UA Amber (A) Yellow   Appearance Ur Clear Clear   Leukocytes, UA Negative Negative   Protein, UA Trace (A) Negative/Trace   Glucose, UA Negative Negative   Ketones, UA Negative Negative   RBC, UA Trace (A) Negative   Bilirubin, UA Negative Negative   Urobilinogen, Ur 0.2 0.2 - 1.0 mg/dL   Nitrite, UA Negative Negative   Microscopic Examination See below:   Lyme Ab/Western Blot Reflex  Result Value Ref Range   Lyme IgG/IgM Ab <0.91 0.00 - 0.90 ISR   LYME DISEASE AB, QUANT, IGM <0.80 0.00 - 0.79 index  Rocky mtn spotted fvr abs pnl(IgG+IgM)  Result Value Ref Range   RMSF IgG Negative Negative   RMSF IgM 0.41 0.00 - 3.87 index  Ehrlichia Antibody Panel  Result Value Ref Range   E.Chaffeensis (HME) IgG Negative Neg:<1:64   E. Chaffeensis (HME) IgM Titer Negative Neg:<1:20   HGE IgG Titer Negative Neg:<1:64   HGE IgM Titer Negative Neg:<1:20  Babesia microti Antibody Panel  Result Value Ref Range   Babesia microti IgM <1:10 Neg:<1:10   Babesia microti IgG <1:10 Neg:<1:10      Assessment & Plan:   Problem List Items Addressed This Visit      Digestive   GERD (gastroesophageal reflux disease)    Not under good control. Will change to dexilant and recheck in 1 month. Call with any concerns.       Relevant Medications   dexlansoprazole (DEXILANT) 60 MG capsule      Genitourinary   Hypertensive nephropathy - Primary    Under good control on recheck. Will continue current regimen. Continue to monitor. Call with any concerns.       Relevant Orders   Basic metabolic panel     Other   Tobacco use disorder    Will start chantix. Recheck 1 month. Call with any concerns.        Other Visit Diagnoses    Immunization due       Flu shot given today   Relevant Orders   Flu Vaccine QUAD 6+ mos PF IM (Fluarix Quad PF) (Completed)       Follow up plan: Return 4-6 weeks, for follow up smoking.

## 2017-02-23 NOTE — Assessment & Plan Note (Signed)
Under good control on recheck. Will continue current regimen. Continue to monitor. Call with any concerns.

## 2017-02-23 NOTE — Assessment & Plan Note (Signed)
Not under good control. Will change to dexilant and recheck in 1 month. Call with any concerns.

## 2017-02-24 ENCOUNTER — Encounter: Payer: Self-pay | Admitting: Family Medicine

## 2017-02-24 LAB — BASIC METABOLIC PANEL
BUN / CREAT RATIO: 22 — AB (ref 9–20)
BUN: 20 mg/dL (ref 6–24)
CO2: 21 mmol/L (ref 20–29)
Calcium: 9.4 mg/dL (ref 8.7–10.2)
Chloride: 106 mmol/L (ref 96–106)
Creatinine, Ser: 0.92 mg/dL (ref 0.76–1.27)
GFR, EST AFRICAN AMERICAN: 109 mL/min/{1.73_m2} (ref >59)
GFR, EST NON AFRICAN AMERICAN: 94 mL/min/{1.73_m2} (ref >59)
Glucose: 61 mg/dL — ABNORMAL LOW (ref 65–99)
Potassium: 4.5 mmol/L (ref 3.5–5.2)
SODIUM: 144 mmol/L (ref 134–144)

## 2017-03-03 ENCOUNTER — Ambulatory Visit: Payer: BC Managed Care – PPO | Admitting: Family Medicine

## 2017-03-23 ENCOUNTER — Other Ambulatory Visit: Payer: Self-pay | Admitting: Family Medicine

## 2017-03-23 NOTE — Telephone Encounter (Signed)
Copied from [place]. Topic: Quick Communication - Rx Refill/Question >> Mar 23, 2017  3:58 PM Corie Chiquito, [place] wrote: Medication: Chantix 1 mg   Has the patient contacted their pharmacy? Yes   Patient calling because he needs a refill on the above medication. He has been in contact with his pharmacy as well and was told he needed to contact his doctors office  Preferred Pharmacy (with phone number or street name): [facility] [place] [phone]   Agent: Please be advised that RX refills may take up to 3 business days. We ask that you follow-up with your pharmacy.

## 2017-03-24 NOTE — Telephone Encounter (Signed)
LOV: 02/23/17  PCP: Dr. Wynetta Emery  Pharmacy: [facility]  [place],  [place]

## 2017-03-25 MED ORDER — VARENICLINE TARTRATE 1 MG PO TABS: 1.0000 mg | ORAL_TABLET | Freq: Two times a day (BID) | ORAL | 3 refills | Status: DC

## 2017-03-31 ENCOUNTER — Ambulatory Visit: Payer: BC Managed Care – PPO | Admitting: Family Medicine

## 2017-03-31 ENCOUNTER — Encounter: Payer: Self-pay | Admitting: Family Medicine

## 2017-03-31 VITALS — BP 130/80 | HR 64 | Wt 170.2 lb

## 2017-03-31 DIAGNOSIS — F17200 Nicotine dependence, unspecified, uncomplicated: Secondary | ICD-10-CM

## 2017-03-31 NOTE — Progress Notes (Signed)
BP 130/80 (BP Location: Left Arm, Cuff Size: Normal)   Pulse 64   Wt 170 lb 4 oz (77.2 kg)   SpO2 98%   BMI 24.43 kg/m    Subjective:    Patient ID: Daniel Mcneil, male    DOB: [birth year], 57 y.o.   MRN: [id number]  HPI: Daniel Mcneil is a 57 y.o. male  Chief Complaint  Patient presents with  . Nicotine Dependence    Patient is on the continuing pack of Chantix, he states that he is smoking about 1/2 a pack a day now.   SMOKING CESSATION Smoking Status: current everyday smoker Smoking Amount: 1/2ppd Smoking Onset: 57yo Smoking Quit Date: Not set Smoking triggers: driving, after eating, hanging out, drinking Type of tobacco use: cigarettes Children in the house: no Other household members who smoke: no Treatments attempted: chantix Pneumovax: up to date  Relevant past medical, surgical, family and social history reviewed and updated as indicated. Interim medical history since our last visit reviewed. Allergies and medications reviewed and updated.  Review of Systems  Constitutional: Negative.   Respiratory: Negative.   Cardiovascular: Negative.   Psychiatric/Behavioral: Negative.     Per HPI unless specifically indicated above     Objective:    BP 130/80 (BP Location: Left Arm, Cuff Size: Normal)   Pulse 64   Wt 170 lb 4 oz (77.2 kg)   SpO2 98%   BMI 24.43 kg/m   Wt Readings from Last 3 Encounters:  03/31/17 170 lb 4 oz (77.2 kg)  02/23/17 166 lb 6 oz (75.5 kg)  08/19/16 158 lb 4 oz (71.8 kg)    Physical Exam  Constitutional: He is oriented to person, place, and time. He appears well-developed and well-nourished. No distress.  HENT:  Head: Normocephalic and atraumatic.  Right Ear: Hearing normal.  Left Ear: Hearing normal.  Nose: Nose normal.  Eyes: Conjunctivae and lids are normal. Right eye exhibits no discharge. Left eye exhibits no discharge. No scleral icterus.  Cardiovascular: Normal rate, regular rhythm, normal heart sounds and intact distal  pulses. Exam reveals no gallop and no friction rub.  No murmur heard. Pulmonary/Chest: Effort normal and breath sounds normal. No respiratory distress. He has no wheezes. He has no rales. He exhibits no tenderness.  Musculoskeletal: Normal range of motion.  Neurological: He is alert and oriented to person, place, and time.  Skin: Skin is warm, dry and intact. No rash noted. He is not diaphoretic. No erythema. No pallor.  Psychiatric: He has a normal mood and affect. His speech is normal and behavior is normal. Judgment and thought content normal. Cognition and memory are normal.  Nursing note and vitals reviewed.   Results for orders placed or performed in visit on 62/95/28  Basic metabolic panel  Result Value Ref Range   Glucose 61 (L) 65 - 99 mg/dL   BUN 20 6 - 24 mg/dL   Creatinine, Ser 0.92 0.76 - 1.27 mg/dL   GFR calc non Af Amer 94 >59 mL/min/1.73   GFR calc Af Amer 109 >59 mL/min/1.73   BUN/Creatinine Ratio 22 (H) 9 - 20   Sodium 144 134 - 144 mmol/L   Potassium 4.5 3.5 - 5.2 mmol/L   Chloride 106 96 - 106 mmol/L   CO2 21 20 - 29 mmol/L   Calcium 9.4 8.7 - 10.2 mg/dL      Assessment & Plan:   Problem List Items Addressed This Visit      Other   Tobacco use  disorder - Primary    Cut down to 1/2ppd- continue chantix. Call with any concerns. Continue to monitor.           Follow up plan: Return After July 25 for physical.

## 2017-03-31 NOTE — Assessment & Plan Note (Signed)
Cut down to 1/2ppd- continue chantix. Call with any concerns. Continue to monitor.

## 2017-12-13 ENCOUNTER — Encounter: Payer: Self-pay | Admitting: Family Medicine

## 2017-12-13 ENCOUNTER — Ambulatory Visit: Payer: BC Managed Care – PPO | Admitting: Family Medicine

## 2017-12-13 VITALS — BP 152/90 | HR 74 | Temp 98.9°F | Ht 70.0 in | Wt 173.4 lb

## 2017-12-13 DIAGNOSIS — K219 Gastro-esophageal reflux disease without esophagitis: Secondary | ICD-10-CM | POA: Diagnosis not present

## 2017-12-13 DIAGNOSIS — Z0000 Encounter for general adult medical examination without abnormal findings: Secondary | ICD-10-CM | POA: Diagnosis not present

## 2017-12-13 DIAGNOSIS — I129 Hypertensive chronic kidney disease with stage 1 through stage 4 chronic kidney disease, or unspecified chronic kidney disease: Secondary | ICD-10-CM

## 2017-12-13 DIAGNOSIS — N181 Chronic kidney disease, stage 1: Secondary | ICD-10-CM

## 2017-12-13 DIAGNOSIS — Z13220 Encounter for screening for lipoid disorders: Secondary | ICD-10-CM | POA: Diagnosis not present

## 2017-12-13 DIAGNOSIS — F17200 Nicotine dependence, unspecified, uncomplicated: Secondary | ICD-10-CM

## 2017-12-13 DIAGNOSIS — Z125 Encounter for screening for malignant neoplasm of prostate: Secondary | ICD-10-CM

## 2017-12-13 DIAGNOSIS — Z23 Encounter for immunization: Secondary | ICD-10-CM | POA: Diagnosis not present

## 2017-12-13 LAB — MICROALBUMIN, URINE WAIVED
Creatinine, Urine Waived: 100 mg/dL (ref 10–300)
Microalb, Ur Waived: 80 mg/L — ABNORMAL HIGH (ref 0–19)

## 2017-12-13 LAB — UA/M W/RFLX CULTURE, ROUTINE
Bilirubin, UA: NEGATIVE
Glucose, UA: NEGATIVE
Ketones, UA: NEGATIVE
LEUKOCYTES UA: NEGATIVE
Nitrite, UA: NEGATIVE
PH UA: 5.5 (ref 5.0–7.5)
Protein, UA: NEGATIVE
Specific Gravity, UA: 1.025 (ref 1.005–1.030)
UUROB: 0.2 mg/dL (ref 0.2–1.0)

## 2017-12-13 LAB — MICROSCOPIC EXAMINATION
BACTERIA UA: NONE SEEN
EPITHELIAL CELLS (NON RENAL): NONE SEEN /HPF (ref 0–10)

## 2017-12-13 MED ORDER — NAPROXEN 500 MG PO TABS: 500.0000 mg | ORAL_TABLET | Freq: Two times a day (BID) | ORAL | 1 refills | Status: DC

## 2017-12-13 MED ORDER — DEXLANSOPRAZOLE 60 MG PO CPDR: 60.0000 mg | DELAYED_RELEASE_CAPSULE | Freq: Every day | ORAL | 3 refills | Status: DC

## 2017-12-13 MED ORDER — LISINOPRIL 10 MG PO TABS: 15.0000 mg | ORAL_TABLET | Freq: Every day | ORAL | 1 refills | Status: DC

## 2017-12-13 NOTE — Patient Instructions (Addendum)
Influenza (Flu) Vaccine (Inactivated or Recombinant): What You Need to Know 1. Why get vaccinated? Influenza ("flu") is a contagious disease that spreads around the [place] every year, usually between October and May. Flu is caused by influenza viruses, and is spread mainly by coughing, sneezing, and close contact. Anyone can get flu. Flu strikes suddenly and can last several days. Symptoms vary by age, but can include:  fever/chills  sore throat  muscle aches  fatigue  cough  headache  runny or stuffy nose  Flu can also lead to pneumonia and blood infections, and cause diarrhea and seizures in children. If you have a medical condition, such as heart or lung disease, flu can make it worse. Flu is more dangerous for some people. Infants and young children, people 55 years of age and older, pregnant women, and people with certain health conditions or a weakened immune system are at greatest risk. Each year thousands of people in the [place] die from flu, and many more are hospitalized. Flu vaccine can:  keep you from getting flu,  make flu less severe if you do get it, and  keep you from spreading flu to your family and other people. 2. Inactivated and recombinant flu vaccines A dose of flu vaccine is recommended every flu season. Children 6 months through 91 years of age may need two doses during the same flu season. Everyone else needs only one dose each flu season. Some inactivated flu vaccines contain a very small amount of a mercury-based preservative called thimerosal. Studies have not shown thimerosal in vaccines to be harmful, but flu vaccines that do not contain thimerosal are available. There is no live flu virus in flu shots. They cannot cause the flu. There are many flu viruses, and they are always changing. Each year a new flu vaccine is made to protect against three or four viruses that are likely to cause disease in the upcoming flu season. But even when the  vaccine doesn't exactly match these viruses, it may still provide some protection. Flu vaccine cannot prevent:  flu that is caused by a virus not covered by the vaccine, or  illnesses that look like flu but are not.  It takes about 2 weeks for protection to develop after vaccination, and protection lasts through the flu season. 3. Some people should not get this vaccine Tell the person who is giving you the vaccine:  If you have any severe, life-threatening allergies. If you ever had a life-threatening allergic reaction after a dose of flu vaccine, or have a severe allergy to any part of this vaccine, you may be advised not to get vaccinated. Most, but not all, types of flu vaccine contain a small amount of egg protein.  If you ever had Guillain-Barr Syndrome (also called GBS). Some people with a history of GBS should not get this vaccine. This should be discussed with your doctor.  If you are not feeling well. It is usually okay to get flu vaccine when you have a mild illness, but you might be asked to come back when you feel better.  4. Risks of a vaccine reaction With any medicine, including vaccines, there is a chance of reactions. These are usually mild and go away on their own, but serious reactions are also possible. Most people who get a flu shot do not have any problems with it. Minor problems following a flu shot include:  soreness, redness, or swelling where the shot was given  hoarseness  sore,  red or itchy eyes  cough  fever  aches  headache  itching  fatigue  If these problems occur, they usually begin soon after the shot and last 1 or 2 days. More serious problems following a flu shot can include the following:  There may be a small increased risk of Guillain-Barre Syndrome (GBS) after inactivated flu vaccine. This risk has been estimated at 1 or 2 additional cases per million people vaccinated. This is much lower than the risk of severe complications from  flu, which can be prevented by flu vaccine.  Young children who get the flu shot along with pneumococcal vaccine (PCV13) and/or DTaP vaccine at the same time might be slightly more likely to have a seizure caused by fever. Ask your doctor for more information. Tell your doctor if a child who is getting flu vaccine has ever had a seizure.  Problems that could happen after any injected vaccine:  People sometimes faint after a medical procedure, including vaccination. Sitting or lying down for about 15 minutes can help prevent fainting, and injuries caused by a fall. Tell your doctor if you feel dizzy, or have vision changes or ringing in the ears.  Some people get severe pain in the shoulder and have difficulty moving the arm where a shot was given. This happens very rarely.  Any medication can cause a severe allergic reaction. Such reactions from a vaccine are very rare, estimated at about 1 in a million doses, and would happen within a few minutes to a few hours after the vaccination. As with any medicine, there is a very remote chance of a vaccine causing a serious injury or death. The safety of vaccines is always being monitored. For more information, visit: http://www.aguilar.org/ 5. What if there is a serious reaction? What should I look for? Look for anything that concerns you, such as signs of a severe allergic reaction, very high fever, or unusual behavior. Signs of a severe allergic reaction can include hives, swelling of the face and throat, difficulty breathing, a fast heartbeat, dizziness, and weakness. These would start a few minutes to a few hours after the vaccination. What should I do?  If you think it is a severe allergic reaction or other emergency that can't wait, call 9-1-1 and get the person to the nearest hospital. Otherwise, call your doctor.  Reactions should be reported to the Vaccine Adverse Event Reporting System (VAERS). Your doctor should file this report, or you  can do it yourself through the VAERS web site at www.vaers.SamedayNews.es, or by calling 6094730752. ? VAERS does not give medical advice. 6. The National Vaccine Injury Compensation Program The [employer] Vaccine Injury Compensation Program (VICP) is a federal program that was created to compensate people who may have been injured by certain vaccines. Persons who believe they may have been injured by a vaccine can learn about the program and about filing a claim by calling 458-267-6070 or visiting the [place] website at GoldCloset.com.ee. There is a time limit to file a claim for compensation. 7. How can I learn more?  Ask your healthcare provider. He or she can give you the vaccine package insert or suggest other sources of information.  Call your local or state health department.  Contact the Centers for Disease Control and Prevention (CDC): ? Call (540)164-9661 (1-800-CDC-INFO) or ? Visit CDC's website at https://gibson.com/ Vaccine Information Statement, Inactivated Influenza Vaccine (09/01/2013) This information is not intended to replace advice given to you by your health care provider. Make sure  you discuss any questions you have with your health care provider. Document Released: 11/06/2005 Document Revised: 10/03/2015 Document Reviewed: 10/03/2015 Elsevier Interactive Patient Education  2017 [place] Maintenance, Male A healthy lifestyle and preventive care is important for your health and wellness. Ask your health care provider about what schedule of regular examinations is right for you. What should I know about weight and diet? Eat a Healthy Diet  Eat plenty of vegetables, fruits, whole grains, low-fat dairy products, and lean protein.  Do not eat a lot of foods high in solid fats, added sugars, or salt.  Maintain a Healthy Weight Regular exercise can help you achieve or maintain a healthy weight. You should:  Do at least 150 minutes of exercise each  week. The exercise should increase your heart rate and make you sweat (moderate-intensity exercise).  Do strength-training exercises at least twice a week.  Watch Your Levels of Cholesterol and Blood Lipids  Have your blood tested for lipids and cholesterol every 5 years starting at 55 years of age. If you are at high risk for heart disease, you should start having your blood tested when you are 55 years old. You may need to have your cholesterol levels checked more often if: ? Your lipid or cholesterol levels are high. ? You are older than 55 years of age. ? You are at high risk for heart disease.  What should I know about cancer screening? Many types of cancers can be detected early and may often be prevented. Lung Cancer  You should be screened every year for lung cancer if: ? You are a current smoker who has smoked for at least 30 years. ? You are a former smoker who has quit within the past 15 years.  Talk to your health care provider about your screening options, when you should start screening, and how often you should be screened.  Colorectal Cancer  Routine colorectal cancer screening usually begins at 55 years of age and should be repeated every 5-10 years until you are 55 years old. You may need to be screened more often if early forms of precancerous polyps or small growths are found. Your health care provider may recommend screening at an earlier age if you have risk factors for colon cancer.  Your health care provider may recommend using home test kits to check for hidden blood in the stool.  A small camera at the end of a tube can be used to examine your colon (sigmoidoscopy or colonoscopy). This checks for the earliest forms of colorectal cancer.  Prostate and Testicular Cancer  Depending on your age and overall health, your health care provider may do certain tests to screen for prostate and testicular cancer.  Talk to your health care provider about any symptoms or  concerns you have about testicular or prostate cancer.  Skin Cancer  Check your skin from head to toe regularly.  Tell your health care provider about any new moles or changes in moles, especially if: ? There is a change in a mole's size, shape, or color. ? You have a mole that is larger than a pencil eraser.  Always use sunscreen. Apply sunscreen liberally and repeat throughout the day.  Protect yourself by wearing long sleeves, pants, a wide-brimmed hat, and sunglasses when outside.  What should I know about heart disease, diabetes, and high blood pressure?  If you are 60-25 years of age, have your blood pressure checked every 3-5 years. If you are 40 years  of age or older, have your blood pressure checked every year. You should have your blood pressure measured twice-once when you are at a hospital or clinic, and once when you are not at a hospital or clinic. Record the average of the two measurements. To check your blood pressure when you are not at a hospital or clinic, you can use: ? An automated blood pressure machine at a pharmacy. ? A home blood pressure monitor.  Talk to your health care provider about your target blood pressure.  If you are between 84-80 years old, ask your health care provider if you should take aspirin to prevent heart disease.  Have regular diabetes screenings by checking your fasting blood sugar level. ? If you are at a normal weight and have a low risk for diabetes, have this test once every three years after the age of 53. ? If you are overweight and have a high risk for diabetes, consider being tested at a younger age or more often.  A one-time screening for abdominal aortic aneurysm (AAA) by ultrasound is recommended for men aged 14-75 years who are current or former smokers. What should I know about preventing infection? Hepatitis B If you have a higher risk for hepatitis B, you should be screened for this virus. Talk with your health care provider  to find out if you are at risk for hepatitis B infection. Hepatitis C Blood testing is recommended for:  Everyone born from [birth year] through 1963.  Anyone with known risk factors for hepatitis C.  Sexually Transmitted Diseases (STDs)  You should be screened each year for STDs including gonorrhea and chlamydia if: ? You are sexually active and are younger than 55 years of age. ? You are older than 55 years of age and your health care provider tells you that you are at risk for this type of infection. ? Your sexual activity has changed since you were last screened and you are at an increased risk for chlamydia or gonorrhea. Ask your health care provider if you are at risk.  Talk with your health care provider about whether you are at high risk of being infected with HIV. Your health care provider may recommend a prescription medicine to help prevent HIV infection.  What else can I do?  Schedule regular health, dental, and eye exams.  Stay current with your vaccines (immunizations).  Do not use any tobacco products, such as cigarettes, chewing tobacco, and e-cigarettes. If you need help quitting, ask your health care provider.  Limit alcohol intake to no more than 2 drinks per day. One drink equals 12 ounces of beer, 5 ounces of wine, or 1 ounces of hard liquor.  Do not use street drugs.  Do not share needles.  Ask your health care provider for help if you need support or information about quitting drugs.  Tell your health care provider if you often feel depressed.  Tell your health care provider if you have ever been abused or do not feel safe at home. This information is not intended to replace advice given to you by your health care provider. Make sure you discuss any questions you have with your health care provider. Document Released: 07/11/2007 Document Revised: 09/11/2015 Document Reviewed: 10/16/2014 Elsevier Interactive Patient Education  [employer].

## 2017-12-13 NOTE — Progress Notes (Signed)
BP (!) 152/90 (BP Location: Left Arm, Cuff Size: Normal)   Pulse 74   Temp 98.9 F (37.2 C) (Oral)   Ht 5\' 10"  (1.778 m)   Wt 173 lb 6.4 oz (78.7 kg)   SpO2 90%   BMI 24.88 kg/m    Subjective:    Patient ID: Daniel Mcneil, male    DOB: 04/16/1960, 57 y.o.   MRN: [id number]  HPI: Daniel Mcneil is a 57 y.o. male presenting on 12/13/2017 for comprehensive medical examination. Current medical complaints include:  HYPERTENSION Hypertension status: stable  Satisfied with current treatment? yes Duration of hypertension: chronic BP monitoring frequency:  not checking BP medication side effects:  no Medication compliance: excellent compliance Previous BP meds: lisinopril Aspirin: yes Recurrent headaches: no Visual changes: no Palpitations: no Dyspnea: no Chest pain: no Lower extremity edema: no Dizzy/lightheaded: no  GERD- has been off his dexilant for about a month GERD control status: exacerbated  Satisfied with current treatment? no Heartburn frequency:  Medication side effects: no  Medication compliance: worse Dysphagia: no Odynophagia:  no Hematemesis: no Blood in stool: no EGD: no  Interim Problems from his last visit: no  Depression Screen done today and results listed below:  Depression screen [facility] 2/9 08/19/2016 07/08/2015  Decreased Interest 3 1  Down, Depressed, Hopeless 1 1  PHQ - 2 Score 4 2  Altered sleeping 1 1  Tired, decreased energy 3 1  Change in appetite 1 0  Feeling bad or failure about yourself  0 1  Trouble concentrating 0 0  Moving slowly or fidgety/restless 1 1  Suicidal thoughts 0 0  PHQ-9 Score 10 6    Past Medical History:  Past Medical History:  Diagnosis Date  . Chronic kidney disease, stage I   . Dysphagia   . GERD (gastroesophageal reflux disease)   . Herniated disc, cervical   . Hypertension   . Hypertensive nephropathy   . Lumbar herniated disc   . Tobacco use disorder   . Urinary hesitancy     Surgical History:  Past  Surgical History:  Procedure Laterality Date  . BIOPSY [name] 04/10/2016   Procedure: BIOPSY;  Surgeon: [name], MD;  Location: [place];  Service: Endoscopy;  Laterality: [name];  . COLONOSCOPY WITH PROPOFOL [name] 04/10/2016   Procedure: COLONOSCOPY WITH PROPOFOL;  Surgeon: [name], MD;  Location: [place];  Service: Endoscopy;  Laterality: [name];  requests - not first  . HERNIA REPAIR      Medications:  Current Outpatient Medications on File Prior to Visit  Medication Sig  . Aspirin-Salicylamide-Caffeine (BC HEADACHE POWDER PO) Take by mouth as needed.  . Multiple Vitamins-Minerals (EQ MULTIVITAMINS ADULT GUMMY PO) Take by mouth daily.   No current facility-administered medications on file prior to visit.     Allergies:  Allergies  Allergen Reactions  . Flomax [Tamsulosin]     dizziness    Social History:  Social History   Socioeconomic History  . Marital status: Married    Spouse name: Not on file  . Number of children: Not on file  . Years of education: Not on file  . Highest education level: Not on file  Occupational History  . Not on file  Social Needs  . Financial resource strain: Not on file  . Food insecurity:    Worry: Not on file    Inability: Not on file  . Transportation needs:    Medical: Not on file    Non-medical: Not on  file  Tobacco Use  . Smoking status: Current Every Day Smoker    Packs/day: 1.00    Years: 36.00    Pack years: 36.00    Types: Cigarettes  . Smokeless tobacco: Never Used  . Tobacco comment: since age 77.  Substance and Sexual Activity  . Alcohol use: Yes    Alcohol/week: 15.0 standard drinks    Types: 15 Cans of beer per week    Comment:    . Drug use: No  . Sexual activity: Yes    Birth control/protection: None  Lifestyle  . Physical activity:    Days per week: Not on file    Minutes per session: Not on file  . Stress: Not on file  Relationships  . Social connections:    Talks on phone: Not on  file    Gets together: Not on file    Attends religious service: Not on file    Active member of club or organization: Not on file    Attends meetings of clubs or organizations: Not on file    Relationship status: Not on file  . Intimate partner violence:    Fear of current or ex partner: Not on file    Emotionally abused: Not on file    Physically abused: Not on file    Forced sexual activity: Not on file  Other Topics Concern  . Not on file  Social History Narrative  . Not on file   Social History   Tobacco Use  Smoking Status Current Every Day Smoker  . Packs/day: 1.00  . Years: 36.00  . Pack years: 36.00  . Types: Cigarettes  Smokeless Tobacco Never Used  Tobacco Comment   since age 64.   Social History   Substance and Sexual Activity  Alcohol Use Yes  . Alcohol/week: 15.0 standard drinks  . Types: 15 Cans of beer per week   Comment:      Family History:  Family History  Problem Relation Age of Onset  . Stroke Mother   . Hypertension Mother   . Cancer Father   . Hypertension Father   . Diabetes Father   . Heart disease Father   . Cancer Maternal Grandfather   . Hypertension Sister   . Cancer Sister        Lung cancer    Past medical history, surgical history, medications, allergies, family history and social history reviewed with patient today and changes made to appropriate areas of the chart.   Review of Systems  Constitutional: Negative.   HENT: Positive for hearing loss. Negative for congestion, ear discharge, ear pain, nosebleeds, sinus pain, sore throat and tinnitus.   Eyes: Negative.   Respiratory: Positive for cough and wheezing. Negative for hemoptysis, sputum production, shortness of breath and stridor.   Cardiovascular: Negative.   Gastrointestinal: Positive for heartburn. Negative for abdominal pain, blood in stool, constipation, diarrhea, melena, nausea and vomiting.  Genitourinary: Negative.   Musculoskeletal: Negative.   Skin:  Negative.   Neurological: Negative.   Endo/Heme/Allergies: Negative for environmental allergies and polydipsia. Bruises/bleeds easily.  Psychiatric/Behavioral: Negative.        Grieving his sister   All other ROS negative except what is listed above and in the HPI.      Objective:    BP (!) 152/90 (BP Location: Left Arm, Cuff Size: Normal)   Pulse 74   Temp 98.9 F (37.2 C) (Oral)   Ht 5\' 10"  (1.778 m)   Wt 173 lb 6.4 oz (  78.7 kg)   SpO2 90%   BMI 24.88 kg/m   Wt Readings from Last 3 Encounters:  12/13/17 173 lb 6.4 oz (78.7 kg)  03/31/17 170 lb 4 oz (77.2 kg)  02/23/17 166 lb 6 oz (75.5 kg)    Physical Exam  Constitutional: He is oriented to person, place, and time. He appears well-developed and well-nourished. No distress.  HENT:  Head: Normocephalic and atraumatic.  Right Ear: Hearing and external ear normal.  Left Ear: Hearing and external ear normal.  Nose: Nose normal.  Mouth/Throat: Oropharynx is clear and moist. No oropharyngeal exudate.  Eyes: Pupils are equal, round, and reactive to light. Conjunctivae, EOM and lids are normal. Right eye exhibits no discharge. Left eye exhibits no discharge. No scleral icterus.  Neck: Normal range of motion. Neck supple. No JVD present. No tracheal deviation present. No thyromegaly present.  Cardiovascular: Normal rate, regular rhythm, normal heart sounds and intact distal pulses. Exam reveals no gallop and no friction rub.  No murmur heard. Pulmonary/Chest: Effort normal and breath sounds normal. No stridor. No respiratory distress. He has no wheezes. He has no rales. He exhibits no tenderness.  Abdominal: Soft. Bowel sounds are normal. He exhibits no distension and no mass. There is no tenderness. There is no rebound and no guarding. No hernia.  Genitourinary:  Genitourinary Comments: Genital exam deferred with shared decision making  Musculoskeletal: Normal range of motion. He exhibits no edema, tenderness or deformity.    Lymphadenopathy:    He has no cervical adenopathy.  Neurological: He is alert and oriented to person, place, and time. He displays normal reflexes. No cranial nerve deficit or sensory deficit. He exhibits normal muscle tone. Coordination normal.  Skin: Skin is warm, dry and intact. Capillary refill takes less than 2 seconds. No rash noted. He is not diaphoretic. No erythema. No pallor.  Psychiatric: He has a normal mood and affect. His speech is normal and behavior is normal. Judgment and thought content normal. Cognition and memory are normal.  Nursing note and vitals reviewed.   Results for orders placed or performed in visit on 40/98/11  Basic metabolic panel  Result Value Ref Range   Glucose 61 (L) 65 - 99 mg/dL   BUN 20 6 - 24 mg/dL   Creatinine, Ser 0.92 0.76 - 1.27 mg/dL   GFR calc non Af Amer 94 >59 mL/min/1.73   GFR calc Af Amer 109 >59 mL/min/1.73   BUN/Creatinine Ratio 22 (H) 9 - 20   Sodium 144 134 - 144 mmol/L   Potassium 4.5 3.5 - 5.2 mmol/L   Chloride 106 96 - 106 mmol/L   CO2 21 20 - 29 mmol/L   Calcium 9.4 8.7 - 10.2 mg/dL      Assessment & Plan:   Problem List Items Addressed This Visit      Digestive   GERD (gastroesophageal reflux disease)    Not under good control. Has been out of his dexilant. Restart and recheck 1 month.       Relevant Medications   dexlansoprazole (DEXILANT) 60 MG capsule   Other Relevant Orders   CBC with Differential/Platelet   Comprehensive metabolic panel   UA/M w/rflx Culture, Routine     Genitourinary   Hypertensive nephropathy   Relevant Orders   Comprehensive metabolic panel   Microalbumin, Urine Waived   TSH   UA/M w/rflx Culture, Routine   Chronic kidney disease, stage I    Rechecking levels today. Await results. Call with any concerns.  Other   Tobacco use disorder    Did great on the chantix- but insurance wouldn't pay for it- would like to go back on it January. 5 minutes spent today in counseling for  tobacco cessation.       Other Visit Diagnoses    Routine general medical examination at a health care facility    -  Primary   Vaccines up to date. Screening labs checked today. Continue diet and exercise. Colonoscopy up to date.    Relevant Orders   Comprehensive metabolic panel   Lipid Panel w/o Chol/HDL Ratio   Microalbumin, Urine Waived   PSA   TSH   UA/M w/rflx Culture, Routine   Need for influenza vaccination       Flu shot given today.   Relevant Orders   Flu Vaccine QUAD 36+ mos IM (Completed)   Screening for cholesterol level       Labs checked today, await results.    Relevant Orders   Lipid Panel w/o Chol/HDL Ratio   Screening for prostate cancer       Labs checked today, await results.    Relevant Orders   PSA      Discussed aspirin prophylaxis for myocardial infarction prevention and decision was made to continue ASA  LABORATORY TESTING:  Health maintenance labs ordered today as discussed above.   The natural history of prostate cancer and ongoing controversy regarding screening and potential treatment outcomes of prostate cancer has been discussed with the patient. The meaning of a false positive PSA and a false negative PSA has been discussed. He indicates understanding of the limitations of this screening test and wishes to proceed with screening PSA testing.  IMMUNIZATIONS:   - Tdap: Tetanus vaccination status reviewed: last tetanus booster within 10 years. - Influenza: Up to date - Pneumovax: Up to date  SCREENING: - Colonoscopy: Up to date  Discussed with patient purpose of the colonoscopy is to detect colon cancer at curable precancerous or early stages   PATIENT COUNSELING:    Sexuality: Discussed sexually transmitted diseases, partner selection, use of condoms, avoidance of unintended pregnancy  and contraceptive alternatives.   Advised to avoid cigarette smoking.  I discussed with the patient that most people either abstain from alcohol or  drink within safe limits (<=14/week and <=4 drinks/occasion for males, <=7/weeks and <= 3 drinks/occasion for females) and that the risk for alcohol disorders and other health effects rises proportionally with the number of drinks per week and how often a drinker exceeds daily limits.  Discussed cessation/primary prevention of drug use and availability of treatment for abuse.   Diet: Encouraged to adjust caloric intake to maintain  or achieve ideal body weight, to reduce intake of dietary saturated fat and total fat, to limit sodium intake by avoiding high sodium foods and not adding table salt, and to maintain adequate dietary potassium and calcium preferably from fresh fruits, vegetables, and low-fat dairy products.    stressed the importance of regular exercise  Injury prevention: Discussed safety belts, safety helmets, smoke detector, smoking near bedding or upholstery.   Dental health: Discussed importance of regular tooth brushing, flossing, and dental visits.   Follow up plan: NEXT PREVENTATIVE PHYSICAL DUE IN 1 YEAR. Return January, for follow up BP and smoking.

## 2017-12-13 NOTE — Assessment & Plan Note (Signed)
Rechecking levels today. Await results. Call with any concerns.  

## 2017-12-13 NOTE — Assessment & Plan Note (Signed)
Not under good control. Has been out of his dexilant. Restart and recheck 1 month.

## 2017-12-13 NOTE — Assessment & Plan Note (Addendum)
Did great on the chantix- but insurance wouldn't pay for it- would like to go back on it January. 5 minutes spent today in counseling for tobacco cessation.

## 2017-12-14 ENCOUNTER — Encounter: Payer: Self-pay | Admitting: Family Medicine

## 2017-12-14 LAB — LIPID PANEL W/O CHOL/HDL RATIO
CHOLESTEROL TOTAL: 215 mg/dL — AB (ref 100–199)
HDL: 34 mg/dL — AB (ref >39)
LDL Calculated: 155 mg/dL — ABNORMAL HIGH (ref 0–99)
Triglycerides: 129 mg/dL (ref 0–149)
VLDL Cholesterol Cal: 26 mg/dL (ref 5–40)

## 2017-12-14 LAB — COMPREHENSIVE METABOLIC PANEL
A/G RATIO: 1.8 (ref 1.2–2.2)
ALK PHOS: 80 IU/L (ref 39–117)
ALT: 14 IU/L (ref 0–44)
AST: 19 IU/L (ref 0–40)
Albumin: 4.3 g/dL (ref 3.5–5.5)
BILIRUBIN TOTAL: 0.2 mg/dL (ref 0.0–1.2)
BUN / CREAT RATIO: 18 (ref 9–20)
BUN: 17 mg/dL (ref 6–24)
CHLORIDE: 108 mmol/L — AB (ref 96–106)
CO2: 20 mmol/L (ref 20–29)
Calcium: 9 mg/dL (ref 8.7–10.2)
Creatinine, Ser: 0.96 mg/dL (ref 0.76–1.27)
GFR calc Af Amer: 103 mL/min/{1.73_m2} (ref >59)
GFR calc non Af Amer: 89 mL/min/{1.73_m2} (ref >59)
Globulin, Total: 2.4 g/dL (ref 1.5–4.5)
Glucose: 77 mg/dL (ref 65–99)
POTASSIUM: 4 mmol/L (ref 3.5–5.2)
Sodium: 143 mmol/L (ref 134–144)
TOTAL PROTEIN: 6.7 g/dL (ref 6.0–8.5)

## 2017-12-14 LAB — CBC WITH DIFFERENTIAL/PLATELET
BASOS ABS: 0 10*3/uL (ref 0.0–0.2)
Basos: 1 %
EOS (ABSOLUTE): 0.2 10*3/uL (ref 0.0–0.4)
Eos: 3 %
Hematocrit: 45.2 % (ref 37.5–51.0)
Hemoglobin: 15.3 g/dL (ref 13.0–17.7)
IMMATURE GRANS (ABS): 0 10*3/uL (ref 0.0–0.1)
Immature Granulocytes: 0 %
LYMPHS ABS: 1.7 10*3/uL (ref 0.7–3.1)
Lymphs: 26 %
MCH: 30.2 pg (ref 26.6–33.0)
MCHC: 33.8 g/dL (ref 31.5–35.7)
MCV: 89 fL (ref 79–97)
MONOS ABS: 0.7 10*3/uL (ref 0.1–0.9)
Monocytes: 10 %
NEUTROS ABS: 4 10*3/uL (ref 1.4–7.0)
Neutrophils: 60 %
Platelets: 215 10*3/uL (ref 150–450)
RBC: 5.07 x10E6/uL (ref 4.14–5.80)
RDW: 13.4 % (ref 12.3–15.4)
WBC: 6.6 10*3/uL (ref 3.4–10.8)

## 2017-12-14 LAB — TSH: TSH: 1.4 u[IU]/mL (ref 0.450–4.500)

## 2017-12-14 LAB — PSA: Prostate Specific Ag, Serum: 0.2 ng/mL (ref 0.0–4.0)

## 2018-02-10 ENCOUNTER — Ambulatory Visit (INDEPENDENT_AMBULATORY_CARE_PROVIDER_SITE_OTHER): Payer: BC Managed Care – PPO | Admitting: Family Medicine

## 2018-02-10 ENCOUNTER — Encounter: Payer: Self-pay | Admitting: Family Medicine

## 2018-02-10 VITALS — BP 125/85 | HR 72 | Temp 97.8°F | Ht 69.0 in | Wt 171.4 lb

## 2018-02-10 DIAGNOSIS — I129 Hypertensive chronic kidney disease with stage 1 through stage 4 chronic kidney disease, or unspecified chronic kidney disease: Secondary | ICD-10-CM | POA: Diagnosis not present

## 2018-02-10 DIAGNOSIS — M5441 Lumbago with sciatica, right side: Secondary | ICD-10-CM | POA: Diagnosis not present

## 2018-02-10 DIAGNOSIS — F17200 Nicotine dependence, unspecified, uncomplicated: Secondary | ICD-10-CM | POA: Diagnosis not present

## 2018-02-10 MED ORDER — VARENICLINE TARTRATE 1 MG PO TABS: 1.0000 mg | ORAL_TABLET | Freq: Two times a day (BID) | ORAL | 2 refills | Status: DC

## 2018-02-10 MED ORDER — VARENICLINE TARTRATE 0.5 MG X 11 & 1 MG X 42 PO MISC: ORAL | 0 refills | Status: DC

## 2018-02-10 MED ORDER — CYCLOBENZAPRINE HCL 10 MG PO TABS: 10.0000 mg | ORAL_TABLET | Freq: Every day | ORAL | 0 refills | Status: DC

## 2018-02-10 NOTE — Progress Notes (Signed)
BP 125/85 (BP Location: Left Arm, Patient Position: Sitting, Cuff Size: Normal)   Pulse 72   Temp 97.8 F (36.6 C) (Oral)   Ht 5\' 9"  (1.753 m)   Wt 171 lb 6.4 oz (77.7 kg)   SpO2 99%   BMI 25.31 kg/m    Subjective:    Patient ID: Daniel Mcneil, male    DOB: [birth year], 58 y.o.   MRN: [id number]  HPI: Daniel Mcneil is a 58 y.o. male  Chief Complaint  Patient presents with  . Hypertension    Follow-up  . Nicotine Dependence  . Back Pain    Patient states that his back still hurts since 01/20/2018. Patient states he was washing a car.   HYPERTENSION Hypertension status: controlled  Satisfied with current treatment? yes Duration of hypertension: chronic BP monitoring frequency:  not checking BP medication side effects:  no Medication compliance: excellent compliance Previous BP meds:lisinopril Aspirin: no Recurrent headaches: no Visual changes: no Palpitations: no Dyspnea: no Chest pain: no Lower extremity edema: no Dizzy/lightheaded: no  BACK PAIN Duration: 3 weeks- stood up while washing the car, twisted and felt a pulling and has been hurting since Mechanism of injury: lifting Location: R>L and low back Onset: sudden Severity: severe Quality: burning, stabbing Frequency: constant worse with movement  Or getting up Radiation: R leg below the knee Aggravating factors: movement Alleviating factors: nothing Status: stable Treatments attempted: heat and aleve  Relief with NSAIDs?: mild Nighttime pain:  yes Paresthesias / decreased sensation:  yes Bowel / bladder incontinence:  no Fevers:  no Dysuria / urinary frequency:  no  Relevant past medical, surgical, family and social history reviewed and updated as indicated. Interim medical history since our last visit reviewed. Allergies and medications reviewed and updated.  Review of Systems  Constitutional: Negative.   Respiratory: Negative.   Cardiovascular: Negative.   Musculoskeletal: Positive for back  pain and myalgias. Negative for arthralgias, gait problem, joint swelling, neck pain and neck stiffness.  Skin: Negative.   Neurological: Positive for numbness. Negative for dizziness, tremors, seizures, syncope, facial asymmetry, speech difficulty, weakness, light-headedness and headaches.  Psychiatric/Behavioral: Negative.     Per HPI unless specifically indicated above     Objective:    BP 125/85 (BP Location: Left Arm, Patient Position: Sitting, Cuff Size: Normal)   Pulse 72   Temp 97.8 F (36.6 C) (Oral)   Ht 5\' 9"  (1.753 m)   Wt 171 lb 6.4 oz (77.7 kg)   SpO2 99%   BMI 25.31 kg/m   Wt Readings from Last 3 Encounters:  02/10/18 171 lb 6.4 oz (77.7 kg)  12/13/17 173 lb 6.4 oz (78.7 kg)  03/31/17 170 lb 4 oz (77.2 kg)    Physical Exam Vitals signs and nursing note reviewed.  Constitutional:      General: He is not in acute distress.    Appearance: Normal appearance. He is not ill-appearing, toxic-appearing or diaphoretic.  HENT:     Head: Normocephalic and atraumatic.     Right Ear: External ear normal.     Left Ear: External ear normal.     Nose: Nose normal.     Mouth/Throat:     Mouth: Mucous membranes are moist.     Pharynx: Oropharynx is clear.  Eyes:     General: No scleral icterus.       Right eye: No discharge.        Left eye: No discharge.     Extraocular Movements: Extraocular movements intact.  Conjunctiva/sclera: Conjunctivae normal.     Pupils: Pupils are equal, round, and reactive to light.  Neck:     Musculoskeletal: Normal range of motion and neck supple.  Cardiovascular:     Rate and Rhythm: Normal rate and regular rhythm.     Pulses: Normal pulses.     Heart sounds: Normal heart sounds. No murmur. No friction rub. No gallop.   Pulmonary:     Effort: Pulmonary effort is normal. No respiratory distress.     Breath sounds: Normal breath sounds. No stridor. No wheezing, rhonchi or rales.  Chest:     Chest wall: No tenderness.    Musculoskeletal: Normal range of motion.  Skin:    General: Skin is warm and dry.     Capillary Refill: Capillary refill takes less than 2 seconds.     Coloration: Skin is not jaundiced or pale.     Findings: No bruising, erythema, lesion or rash.  Neurological:     General: No focal deficit present.     Mental Status: He is alert and oriented to person, place, and time. Mental status is at baseline.  Psychiatric:        Mood and Affect: Mood normal.        Behavior: Behavior normal.        Thought Content: Thought content normal.        Judgment: Judgment normal.     Results for orders placed or performed in visit on 12/13/17  Microscopic Examination  Result Value Ref Range   WBC, UA 0-5 0 - 5 /hpf   RBC, UA 0-2 0 - 2 /hpf   Epithelial Cells (non renal) None seen 0 - 10 /hpf   Bacteria, UA None seen None seen/Few  CBC with Differential/Platelet  Result Value Ref Range   WBC 6.6 3.4 - 10.8 x10E3/uL   RBC 5.07 4.14 - 5.80 x10E6/uL   Hemoglobin 15.3 13.0 - 17.7 g/dL   Hematocrit 45.2 37.5 - 51.0 %   MCV 89 79 - 97 fL   MCH 30.2 26.6 - 33.0 pg   MCHC 33.8 31.5 - 35.7 g/dL   RDW 13.4 12.3 - 15.4 %   Platelets 215 150 - 450 x10E3/uL   Neutrophils 60 Not Estab. %   Lymphs 26 Not Estab. %   Monocytes 10 Not Estab. %   Eos 3 Not Estab. %   Basos 1 Not Estab. %   Neutrophils Absolute 4.0 1.4 - 7.0 x10E3/uL   Lymphocytes Absolute 1.7 0.7 - 3.1 x10E3/uL   Monocytes Absolute 0.7 0.1 - 0.9 x10E3/uL   EOS (ABSOLUTE) 0.2 0.0 - 0.4 x10E3/uL   Basophils Absolute 0.0 0.0 - 0.2 x10E3/uL   Immature Granulocytes 0 Not Estab. %   Immature Grans (Abs) 0.0 0.0 - 0.1 x10E3/uL  Comprehensive metabolic panel  Result Value Ref Range   Glucose 77 65 - 99 mg/dL   BUN 17 6 - 24 mg/dL   Creatinine, Ser 0.96 0.76 - 1.27 mg/dL   GFR calc non Af Amer 89 >59 mL/min/1.73   GFR calc Af Amer 103 >59 mL/min/1.73   BUN/Creatinine Ratio 18 9 - 20   Sodium 143 134 - 144 mmol/L   Potassium 4.0 3.5 -  5.2 mmol/L   Chloride 108 (H) 96 - 106 mmol/L   CO2 20 20 - 29 mmol/L   Calcium 9.0 8.7 - 10.2 mg/dL   Total Protein 6.7 6.0 - 8.5 g/dL   Albumin 4.3 3.5 - 5.5 g/dL  Globulin, Total 2.4 1.5 - 4.5 g/dL   Albumin/Globulin Ratio 1.8 1.2 - 2.2   Bilirubin Total 0.2 0.0 - 1.2 mg/dL   Alkaline Phosphatase 80 39 - 117 IU/L   AST 19 0 - 40 IU/L   ALT 14 0 - 44 IU/L  Lipid Panel w/o Chol/HDL Ratio  Result Value Ref Range   Cholesterol, Total 215 (H) 100 - 199 mg/dL   Triglycerides 129 0 - 149 mg/dL   HDL 34 (L) >39 mg/dL   VLDL Cholesterol Cal 26 5 - 40 mg/dL   LDL Calculated 155 (H) 0 - 99 mg/dL  Microalbumin, Urine Waived  Result Value Ref Range   Microalb, Ur Waived 80 (H) 0 - 19 mg/L   Creatinine, Urine Waived 100 10 - 300 mg/dL   Microalb/Creat Ratio 30-300 (H) <30 mg/g  PSA  Result Value Ref Range   Prostate Specific Ag, Serum 0.2 0.0 - 4.0 ng/mL  TSH  Result Value Ref Range   TSH 1.400 0.450 - 4.500 uIU/mL  UA/M w/rflx Culture, Routine  Result Value Ref Range   Specific Gravity, UA 1.025 1.005 - 1.030   pH, UA 5.5 5.0 - 7.5   Color, UA Yellow Yellow   Appearance Ur Clear Clear   Leukocytes, UA Negative Negative   Protein, UA Negative Negative/Trace   Glucose, UA Negative Negative   Ketones, UA Negative Negative   RBC, UA 2+ (A) Negative   Bilirubin, UA Negative Negative   Urobilinogen, Ur 0.2 0.2 - 1.0 mg/dL   Nitrite, UA Negative Negative   Microscopic Examination See below:       Assessment & Plan:   Problem List Items Addressed This Visit      Genitourinary   Hypertensive nephropathy - Primary    Under good control on current regimen. Continue current regimen. Continue to monitor. Call with any concerns. Refills given. Labs drawn today.       Relevant Orders   Basic metabolic panel     Other   Tobacco use disorder    Would like to quit- will start chantix and recheck 4-6 weeks. Call with any concerns.        Other Visit Diagnoses    Acute  right-sided low back pain with right-sided sciatica       Will treat with exercises, flexeril and naproxen. Given history of DJD- if not better in 1 week, will get x-ray.   Relevant Medications   cyclobenzaprine (FLEXERIL) 10 MG tablet       Follow up plan: Return 4-6 weeks, for follow up tobacco.

## 2018-02-10 NOTE — Assessment & Plan Note (Signed)
Under good control on current regimen. Continue current regimen. Continue to monitor. Call with any concerns. Refills given. Labs drawn today.   

## 2018-02-10 NOTE — Patient Instructions (Signed)

## 2018-02-10 NOTE — Assessment & Plan Note (Signed)
Would like to quit- will start chantix and recheck 4-6 weeks. Call with any concerns.

## 2018-02-11 ENCOUNTER — Encounter: Payer: Self-pay | Admitting: Family Medicine

## 2018-02-11 LAB — BASIC METABOLIC PANEL
BUN/Creatinine Ratio: 23 — ABNORMAL HIGH (ref 9–20)
BUN: 21 mg/dL (ref 6–24)
CALCIUM: 9.5 mg/dL (ref 8.7–10.2)
CHLORIDE: 105 mmol/L (ref 96–106)
CO2: 19 mmol/L — AB (ref 20–29)
Creatinine, Ser: 0.93 mg/dL (ref 0.76–1.27)
GFR, EST AFRICAN AMERICAN: 106 mL/min/{1.73_m2} (ref >59)
GFR, EST NON AFRICAN AMERICAN: 92 mL/min/{1.73_m2} (ref >59)
Glucose: 76 mg/dL (ref 65–99)
POTASSIUM: 4.3 mmol/L (ref 3.5–5.2)
Sodium: 143 mmol/L (ref 134–144)

## 2018-02-21 ENCOUNTER — Telehealth: Payer: Self-pay

## 2018-02-21 ENCOUNTER — Encounter: Payer: Self-pay | Admitting: Family Medicine

## 2018-02-21 ENCOUNTER — Ambulatory Visit: Payer: BC Managed Care – PPO | Admitting: Family Medicine

## 2018-02-21 ENCOUNTER — Ambulatory Visit
Admission: RE | Admit: 2018-02-21 | Discharge: 2018-02-21 | Disposition: A | Discharge status: Home / Self Care | Payer: BC Managed Care – PPO | Source: Ambulatory Visit | Attending: Family Medicine | Admitting: Family Medicine

## 2018-02-21 VITALS — BP 169/100 | HR 64 | Temp 97.9°F | Ht 69.0 in | Wt 170.4 lb

## 2018-02-21 DIAGNOSIS — M5441 Lumbago with sciatica, right side: Secondary | ICD-10-CM

## 2018-02-21 MED ORDER — PREDNISONE 10 MG PO TABS: ORAL_TABLET | ORAL | 0 refills | Status: DC

## 2018-02-21 MED ORDER — GABAPENTIN 300 MG PO CAPS: 300.0000 mg | ORAL_CAPSULE | Freq: Three times a day (TID) | ORAL | 0 refills | Status: DC

## 2018-02-21 NOTE — Telephone Encounter (Signed)
Will patient still be referred to Neurosurgery?

## 2018-02-21 NOTE — Progress Notes (Signed)
BP (!) 169/100 (BP Location: Left Arm, Patient Position: Sitting, Cuff Size: Normal)   Pulse 64   Temp 97.9 F (36.6 C)   Ht 5\' 9"  (1.753 m)   Wt 170 lb 7 oz (77.3 kg)   SpO2 97%   BMI 25.17 kg/m    Subjective:    Patient ID: Daniel Mcneil, male    DOB: 24-Dec-1960, 58 y.o.   MRN: [id number]  HPI: Daniel Mcneil is a 58 y.o. male  Chief Complaint  Patient presents with  . Back Pain    Patient has been taking the flexeril and the naproxen without any relief   Here today with worsened low back pain with sciatica down right leg. Was seen last week and given stretches, flexeril, and naproxen which have not helped. Pain, numbness, and tingling has gotten worse. Barely able to walk around, not sleeping at all. 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 No known injury, bowel or bladder incontinence, fevers, leg weakness.   Relevant past medical, surgical, family and social history reviewed and updated as indicated. Interim medical history since our last visit reviewed. Allergies and medications reviewed and updated.  Review of Systems  Per HPI unless specifically indicated above     Objective:    BP (!) 169/100 (BP Location: Left Arm, Patient Position: Sitting, Cuff Size: Normal)   Pulse 64   Temp 97.9 F (36.6 C)   Ht 5\' 9"  (1.753 m)   Wt 170 lb 7 oz (77.3 kg)   SpO2 97%   BMI 25.17 kg/m   Wt Readings from Last 3 Encounters:  02/21/18 170 lb 7 oz (77.3 kg)  02/10/18 171 lb 6.4 oz (77.7 kg)  12/13/17 173 lb 6.4 oz (78.7 kg)    Physical Exam Vitals signs and nursing note reviewed.  Constitutional:      Appearance: Normal appearance.     Comments: Appears in pain  HENT:     Head: Atraumatic.  Eyes:     Extraocular Movements: Extraocular movements intact.     Conjunctiva/sclera: Conjunctivae normal.  Neck:     Musculoskeletal: Normal range of motion and neck supple.  Cardiovascular:     Rate and Rhythm: Normal rate and regular rhythm.     Pulses: Normal pulses.  Pulmonary:   Effort: Pulmonary effort is normal.     Breath sounds: Normal breath sounds.  Musculoskeletal: Normal range of motion.        General: No tenderness (no midline ttp).     Comments: Antalgic gait Potentially + SLR right leg, pt described that he had pain but was non-specific  Skin:    General: Skin is warm and dry.  Neurological:     General: No focal deficit present.     Mental Status: He is oriented to person, place, and time.     Sensory: No sensory deficit.     Motor: No weakness.  Psychiatric:        Mood and Affect: Mood normal.        Thought Content: Thought content normal.        Judgment: Judgment normal.     Results for orders placed or performed in visit on 59/74/16  Basic metabolic panel  Result Value Ref Range   Glucose 76 65 - 99 mg/dL   BUN 21 6 - 24 mg/dL   Creatinine, Ser 0.93 0.76 - 1.27 mg/dL   GFR calc non Af Amer 92 >59 mL/min/1.73   GFR calc Af Amer 106 >59 mL/min/1.73  BUN/Creatinine Ratio 23 (H) 9 - 20   Sodium 143 134 - 144 mmol/L   Potassium 4.3 3.5 - 5.2 mmol/L   Chloride 105 96 - 106 mmol/L   CO2 19 (L) 20 - 29 mmol/L   Calcium 9.5 8.7 - 10.2 mg/dL      Assessment & Plan:   Problem List Items Addressed This Visit    None    Visit Diagnoses    Acute right-sided low back pain with right-sided sciatica    -  Primary   Will add prednisone and gabapentin as well as obtain x-ray and refer to Neurosurgery given severity and persistence. Return precautions given   Relevant Medications   predniSONE (DELTASONE) 10 MG tablet   Other Relevant Orders   DG Lumbar Spine Complete (Completed)       Follow up plan: Return if symptoms worsen or fail to improve.

## 2018-02-22 NOTE — Telephone Encounter (Signed)
Yes, referral placed please let him know they will be reaching out to schedule

## 2018-02-22 NOTE — Telephone Encounter (Signed)
Patient notified

## 2018-03-08 ENCOUNTER — Telehealth: Payer: Self-pay | Admitting: Family Medicine

## 2018-03-08 DIAGNOSIS — I714 Abdominal aortic aneurysm, without rupture, unspecified: Secondary | ICD-10-CM

## 2018-03-08 NOTE — Telephone Encounter (Signed)
Received message from Neurosurgery stating that some low back imaging they performed revealed aortic atherosclerosis and possible mild aneurysm - please let him know I went ahead and ordered an ultrasound for further workup for this.   Routing to PCP as FYI as well

## 2018-03-09 NOTE — Telephone Encounter (Signed)
° °  Pt return Ken[place] call

## 2018-03-09 NOTE — Telephone Encounter (Signed)
Message relayed to patient. Verbalized understanding and denied questions.   

## 2018-03-09 NOTE — Telephone Encounter (Signed)
Spoke with patient. Message relayed. Patient wanted to know if the aneurysm was dangerous and where it is in his body, because they only scanned his back. Advised patient that it was likely in his abdominal aorta, but that I would clarify with provider. Also advised patient that most aneurysm do not cause symptoms and are not dangerous, however, would require routine monitoring to ensure that it is not becoming more severe. Please advise.

## 2018-03-09 NOTE — Telephone Encounter (Signed)
Left message on machine for pt to return call to the office.  

## 2018-03-09 NOTE — Telephone Encounter (Signed)
This is why we're getting the [place]. We don't know if there is an aneurysm yet or if it would be dangerous.

## 2018-03-17 ENCOUNTER — Ambulatory Visit: Payer: BC Managed Care – PPO | Admitting: Family Medicine

## 2018-03-26 ENCOUNTER — Other Ambulatory Visit: Payer: Self-pay | Admitting: Family Medicine

## 2018-03-28 NOTE — Telephone Encounter (Signed)
Patient called, left VM to return call to the office to advised if he is needing the refill on Gabapentin. It was ordered at last OV 02/21/18 for a 30 day supply only for low back pain. Will need to know if he needs this before refilling.

## 2018-03-28 NOTE — Telephone Encounter (Signed)
Patient returned call and says he's been taking it everyday for his back. He says that he went to the neurosurgeon and was told he should continue taking it. I advised I will refill enough until the appointment with Dr. Wynetta Emery and he can discuss with her further for additional refills.

## 2018-04-20 ENCOUNTER — Ambulatory Visit (INDEPENDENT_AMBULATORY_CARE_PROVIDER_SITE_OTHER): Payer: BC Managed Care – PPO | Admitting: Family Medicine

## 2018-04-20 ENCOUNTER — Other Ambulatory Visit: Payer: Self-pay

## 2018-04-20 ENCOUNTER — Encounter: Payer: Self-pay | Admitting: Family Medicine

## 2018-04-20 VITALS — BP 157/92 | HR 78 | Wt 161.1 lb

## 2018-04-20 DIAGNOSIS — M4726 Other spondylosis with radiculopathy, lumbar region: Secondary | ICD-10-CM

## 2018-04-20 DIAGNOSIS — R202 Paresthesia of skin: Secondary | ICD-10-CM | POA: Diagnosis not present

## 2018-04-20 DIAGNOSIS — F17200 Nicotine dependence, unspecified, uncomplicated: Secondary | ICD-10-CM

## 2018-04-20 DIAGNOSIS — F17210 Nicotine dependence, cigarettes, uncomplicated: Secondary | ICD-10-CM | POA: Diagnosis not present

## 2018-04-20 DIAGNOSIS — I714 Abdominal aortic aneurysm, without rupture, unspecified: Secondary | ICD-10-CM

## 2018-04-20 MED ORDER — GABAPENTIN 300 MG PO CAPS: ORAL_CAPSULE | ORAL | 1 refills | Status: DC

## 2018-04-20 MED ORDER — BACLOFEN 10 MG PO TABS: 10.0000 mg | ORAL_TABLET | Freq: Every day | ORAL | 0 refills | Status: DC

## 2018-04-20 MED ORDER — NAPROXEN 500 MG PO TABS: 500.0000 mg | ORAL_TABLET | Freq: Two times a day (BID) | ORAL | 1 refills | Status: DC

## 2018-04-20 MED ORDER — VARENICLINE TARTRATE 1 MG PO TABS: 1.0000 mg | ORAL_TABLET | Freq: Two times a day (BID) | ORAL | 0 refills | Status: DC

## 2018-04-20 NOTE — Progress Notes (Signed)
BP (!) 157/92 Comment: Patient reported  Pulse 78 Comment: Patient reported  Wt 161 lb 2 oz (73.1 kg) Comment: Patient reported  BMI 23.79 kg/m    Subjective:    Patient ID: Daniel Mcneil, male    DOB: 09-13-1960, 58 y.o.   MRN: [id number]  HPI: Daniel Mcneil is a 58 y.o. male  Chief Complaint  Patient presents with  . Nicotine Dependence    Needs a refill on Chantix, patient is smoking less than a pack a day   SMOKING CESSATION Smoking Status: current everyday smoker Smoking Amount: less than a pack a day Smoking Quit Date: not set yet Smoking triggers: stress, driving Type of tobacco use: cigarettes Children in the house: no Other household members who smoke: no Treatments attempted: wellbutrin, nicorette, chantix Pneumovax: UTD  Recently had a lumbar x-ray for back pain which showed an AAA. He has not been able to get his AAA US done yet. He is working on quitting smoking. He is otherwise feeling well, but he is anxious about getting this checked on.   His back pain is not doing very well. He saw neurosurgery about a month ago, and they were planning on getting an MRI of his back, however, with the COVID-19 precautions, this has not happened. He notes that the prednisone helped, but the muscle relaxer and pain medicine didn't do anything. His pain is in his L leg all the way down to his foot. It feels numb and tingly most of the time- he is concerned that this is a blood clot as it feels different than the other leg. Pain is aching and shooting and sore. Better with rest and worse with certain movements. He is otherwise doing well with no other concerns or complaints at this time.    Relevant past medical, surgical, family and social history reviewed and updated as indicated. Interim medical history since our last visit reviewed. Allergies and medications reviewed and updated.  Review of Systems  Constitutional: Negative.   Respiratory: Negative.   Cardiovascular: Negative.    Gastrointestinal: Negative.   Musculoskeletal: Positive for back pain and myalgias. Negative for arthralgias, gait problem, joint swelling, neck pain and neck stiffness.  Skin: Negative.   Neurological: Positive for numbness. Negative for dizziness, tremors, seizures, syncope, facial asymmetry, speech difficulty, weakness, light-headedness and headaches.  Hematological: Negative.   Psychiatric/Behavioral: Negative.     Per HPI unless specifically indicated above     Objective:    BP (!) 157/92 Comment: Patient reported  Pulse 78 Comment: Patient reported  Wt 161 lb 2 oz (73.1 kg) Comment: Patient reported  BMI 23.79 kg/m   Wt Readings from Last 3 Encounters:  04/20/18 161 lb 2 oz (73.1 kg)  02/21/18 170 lb 7 oz (77.3 kg)  02/10/18 171 lb 6.4 oz (77.7 kg)    Physical Exam Constitutional:      General: He is not in acute distress.    Appearance: Normal appearance. He is well-developed and normal weight. He is not ill-appearing, toxic-appearing or diaphoretic.  HENT:     Head: Normocephalic and atraumatic.     Right Ear: Hearing and external ear normal.     Left Ear: Hearing and external ear normal.     Nose: Nose normal.     Mouth/Throat:     Mouth: Mucous membranes are moist.     Pharynx: No oropharyngeal exudate or posterior oropharyngeal erythema.  Eyes:     General: Lids are normal. No scleral icterus.  Right eye: No discharge.        Left eye: No discharge.     Extraocular Movements: Extraocular movements intact.     Conjunctiva/sclera: Conjunctivae normal.     Pupils: Pupils are equal, round, and reactive to light.  Neck:     Musculoskeletal: Normal range of motion.  Pulmonary:     Effort: Pulmonary effort is normal. No respiratory distress.  Musculoskeletal: Normal range of motion.  Skin:    Coloration: Skin is not jaundiced or pale.     Findings: No bruising, erythema, lesion or rash.  Neurological:     General: No focal deficit present.     Mental  Status: He is alert and oriented to person, place, and time.  Psychiatric:        Mood and Affect: Mood normal.        Speech: Speech normal.        Behavior: Behavior normal.        Thought Content: Thought content normal.        Judgment: Judgment normal.     Results for orders placed or performed in visit on 53/97/67  Basic metabolic panel  Result Value Ref Range   Glucose 76 65 - 99 mg/dL   BUN 21 6 - 24 mg/dL   Creatinine, Ser 0.93 0.76 - 1.27 mg/dL   GFR calc non Af Amer 92 >59 mL/min/1.73   GFR calc Af Amer 106 >59 mL/min/1.73   BUN/Creatinine Ratio 23 (H) 9 - 20   Sodium 143 134 - 144 mmol/L   Potassium 4.3 3.5 - 5.2 mmol/L   Chloride 105 96 - 106 mmol/L   CO2 19 (L) 20 - 29 mmol/L   Calcium 9.5 8.7 - 10.2 mg/dL      Assessment & Plan:   Problem List Items Addressed This Visit      Cardiovascular and Mediastinum   Abdominal aortic aneurysm (AAA) without rupture ([place])    Found on lumbar x-ray. Needs [place], but unable to obtain during this time due to COVID-19 restrictions. Will refer him to vascular surgeon for evaluation and hopefully [place]. Will keep his BP and cholesterol under good control. He is working on quitting smoking. Call with any concerns.       Relevant Orders   Ambulatory referral to Vascular Surgery     Nervous and Auditory   Osteoarthritis of spine with radiculopathy, lumbar region - Primary    Not better. Unable to get MRI at this time. Will get him into PT- referral generated today. Continue gabapentin and start PRN baclofen as flexeril didn't work. Call with any concerns. Recheck 2-4 weeks.       Relevant Medications   baclofen (LIORESAL) 10 MG tablet   gabapentin (NEURONTIN) 300 MG capsule   varenicline (CHANTIX CONTINUING MONTH PAK) 1 MG tablet   naproxen (NAPROSYN) 500 MG tablet   Other Relevant Orders   Ambulatory referral to Physical Therapy     Other   Tobacco use disorder    Has managed to cut down to less than a pack a day from 4+  packs a day using chantix. May need slightly longer on the medicine than recommended, but the benefits outweigh the risks. Refill sent through today. Will continue to monitor closely and will do PA if needed.        Other Visit Diagnoses    Paresthesias       Likely due to his back DJD, however he is concerned that this is vascular.  Will get him into vascular surgery. Referral generated today. Call with any concerns.   Relevant Orders   Ambulatory referral to Vascular Surgery       Follow up plan: Return in about 4 weeks (around 05/18/2018).    . This visit was completed via Skype due to the restrictions of the COVID-19 pandemic. All issues as above were discussed and addressed. Physical exam was done as above through visual confirmation on Skype. If it was felt that the patient should be evaluated in the office, they were directed there. The patient verbally consented to this visit. . Location of the patient: home . Location of the provider: home . Those involved with this call:  . Provider: Park Liter, DO . CMA: Tiffany Reel, CMA . Front Desk/Registration: Don Perking  . Time spent on call: 30 minutes with patient face to face via video conference. More than 50% of this time was spent in counseling and coordination of care.

## 2018-04-24 ENCOUNTER — Encounter: Payer: Self-pay | Admitting: Family Medicine

## 2018-04-24 DIAGNOSIS — I714 Abdominal aortic aneurysm, without rupture, unspecified: Secondary | ICD-10-CM | POA: Insufficient documentation

## 2018-04-24 DIAGNOSIS — M4726 Other spondylosis with radiculopathy, lumbar region: Secondary | ICD-10-CM | POA: Insufficient documentation

## 2018-04-24 NOTE — Assessment & Plan Note (Signed)
Has managed to cut down to less than a pack a day from 4+ packs a day using chantix. May need slightly longer on the medicine than recommended, but the benefits outweigh the risks. Refill sent through today. Will continue to monitor closely and will do PA if needed.

## 2018-04-24 NOTE — Assessment & Plan Note (Signed)
Not better. Unable to get MRI at this time. Will get him into PT- referral generated today. Continue gabapentin and start PRN baclofen as flexeril didn't work. Call with any concerns. Recheck 2-4 weeks.

## 2018-04-24 NOTE — Assessment & Plan Note (Signed)
Found on lumbar x-ray. Needs [place], but unable to obtain during this time due to COVID-19 restrictions. Will refer him to vascular surgeon for evaluation and hopefully [place]. Will keep his BP and cholesterol under good control. He is working on quitting smoking. Call with any concerns.

## 2018-05-02 ENCOUNTER — Encounter (INDEPENDENT_AMBULATORY_CARE_PROVIDER_SITE_OTHER): Payer: Self-pay | Admitting: Vascular Surgery

## 2018-05-02 ENCOUNTER — Ambulatory Visit (INDEPENDENT_AMBULATORY_CARE_PROVIDER_SITE_OTHER): Payer: BC Managed Care – PPO | Admitting: Vascular Surgery

## 2018-05-02 ENCOUNTER — Other Ambulatory Visit: Payer: Self-pay

## 2018-05-02 VITALS — BP 153/97 | HR 65 | Resp 16 | Ht 69.5 in | Wt 169.0 lb

## 2018-05-02 DIAGNOSIS — I10 Essential (primary) hypertension: Secondary | ICD-10-CM | POA: Diagnosis not present

## 2018-05-02 DIAGNOSIS — F17210 Nicotine dependence, cigarettes, uncomplicated: Secondary | ICD-10-CM | POA: Diagnosis not present

## 2018-05-02 DIAGNOSIS — I714 Abdominal aortic aneurysm, without rupture, unspecified: Secondary | ICD-10-CM

## 2018-05-02 DIAGNOSIS — Z79899 Other long term (current) drug therapy: Secondary | ICD-10-CM

## 2018-05-02 DIAGNOSIS — K219 Gastro-esophageal reflux disease without esophagitis: Secondary | ICD-10-CM | POA: Diagnosis not present

## 2018-05-02 NOTE — Progress Notes (Signed)
MRN : [id number]  Daniel Mcneil is a 58 y.o. (1960-11-25) male who presents with chief complaint of  Chief Complaint  Patient presents with  . New Patient (Initial Visit)    ref Wynetta Emery vascular consult  .  History of Present Illness:   The patient presents to the office for evaluation of an abdominal aortic aneurysm. The aneurysm was found incidentally by lumbar plain films. Patient denies abdominal pain or unusual back pain, no other abdominal complaints.  No history of an acute onset of painful blue discoloration of the toes.     No family history of AAA.   Patient denies amaurosis fugax or TIA symptoms. There is no history of claudication or rest pain symptoms of the lower extremities.  The patient denies angina or shortness of breath.    Current Meds  Medication Sig  . Aspirin-Salicylamide-Caffeine (BC HEADACHE POWDER PO) Take by mouth as needed.  . baclofen (LIORESAL) 10 MG tablet Take 1 tablet (10 mg total) by mouth at bedtime.  Marland Kitchen dexlansoprazole (DEXILANT) 60 MG capsule Take 1 capsule (60 mg total) by mouth daily.  Marland Kitchen gabapentin (NEURONTIN) 300 MG capsule TAKE 1 CAPSULE(300 MG) BY MOUTH THREE TIMES DAILY  . lisinopril (PRINIVIL,ZESTRIL) 10 MG tablet Take 1.5 tablets (15 mg total) by mouth daily.  . Multiple Vitamins-Minerals (EQ MULTIVITAMINS ADULT GUMMY PO) Take by mouth daily.  . naproxen (NAPROSYN) 500 MG tablet Take 1 tablet (500 mg total) by mouth 2 (two) times daily with a meal.  . varenicline (CHANTIX CONTINUING MONTH PAK) 1 MG tablet Take 1 tablet (1 mg total) by mouth 2 (two) times daily.    Past Medical History:  Diagnosis Date  . Chronic kidney disease, stage I   . Dysphagia   . GERD (gastroesophageal reflux disease)   . Herniated disc, cervical   . Hypertension   . Hypertensive nephropathy   . Lumbar herniated disc   . Tobacco use disorder   . Urinary hesitancy     Past Surgical History:  Procedure Laterality Date  . BIOPSY [name] 04/10/2016   Procedure: BIOPSY;  Surgeon: [name], MD;  Location: [facility];  Service: Endoscopy;  Laterality: [name];  . COLONOSCOPY WITH PROPOFOL [name] 04/10/2016   Procedure: COLONOSCOPY WITH PROPOFOL;  Surgeon: [name], MD;  Location: [facility];  Service: Endoscopy;  Laterality: [name];  requests - not first  . HERNIA REPAIR      Social History Social History   Tobacco Use  . Smoking status: Current Every Day Smoker    Packs/day: 1.00    Years: 36.00    Pack years: 36.00    Types: Cigarettes  . Smokeless tobacco: Never Used  . Tobacco comment: since age 71.  Substance Use Topics  . Alcohol use: Yes    Alcohol/week: 15.0 standard drinks    Types: 15 Cans of beer per week    Comment:    . Drug use: No    Family History Family History  Problem Relation Age of Onset  . Stroke Mother   . Hypertension Mother   . Cancer Father   . Hypertension Father   . Diabetes Father   . Heart disease Father   . Cancer Maternal Grandfather   . Hypertension Sister   . Cancer Sister        Lung cancer  No family history of bleeding/clotting disorders, porphyria or autoimmune disease   Allergies  Allergen Reactions  . Flomax [Tamsulosin]     dizziness  REVIEW OF SYSTEMS (Negative unless checked)  Constitutional: [] Weight loss  [] Fever  [] Chills Cardiac: [] Chest pain   [] Chest pressure   [] Palpitations   [] Shortness of breath when laying flat   [x] Shortness of breath with exertion. Vascular:  [] Pain in legs with walking   [] Pain in legs at rest  [] History of DVT   [] Phlebitis   [] Swelling in legs   [] Varicose veins   [] Non-healing ulcers Pulmonary:   [] Uses home oxygen   [] Productive cough   [] Hemoptysis   [] Wheeze  [] COPD   [] Asthma Neurologic:  [] Dizziness   [] Seizures   [] History of stroke   [] History of TIA  [] Aphasia   [] Vissual changes   [] Weakness or numbness in arm   [] Weakness or numbness in leg Musculoskeletal:   [] Joint swelling   [] Joint pain   [] Low back pain  Hematologic:  [] Easy bruising  [] Easy bleeding   [] Hypercoagulable state   [] Anemic Gastrointestinal:  [] Diarrhea   [] Vomiting  [] Gastroesophageal reflux/heartburn   [] Difficulty swallowing. Genitourinary:  [] Chronic kidney disease   [] Difficult urination  [] Frequent urination   [] Blood in urine Skin:  [] Rashes   [] Ulcers  Psychological:  [] History of anxiety   []  History of major depression.  Physical Examination  Vitals:   05/02/18 1455  BP: (!) 153/97  Pulse: 65  Resp: 16  Weight: 169 lb (76.7 kg)  Height: 5' 9.5" (1.765 m)   Body mass index is 24.6 kg/m. Gen: WD/WN, NAD Head: [place]/AT, No temporalis wasting.  Ear/Nose/Throat: Hearing grossly intact, nares w/o erythema or drainage, poor dentition Eyes: PER, EOMI, sclera nonicteric.  Neck: Supple, no masses.  No bruit or JVD.  Pulmonary:  Good air movement, clear to auscultation bilaterally, no use of accessory muscles.  Cardiac: RRR, normal S1, S2, no Murmurs. Vascular: no carotid bruits Vessel Right Left  Radial Palpable Palpable  Popliteal Not enlarged Palpable Not enlarged Palpable  PT Palpable Palpable  DP Palpable Palpable   Gastrointestinal: soft, non-distended. No guarding/no peritoneal signs.  Musculoskeletal: M/S 5/5 throughout.  No deformity or atrophy.  Neurologic: CN 2-12 intact. Pain and light touch intact in extremities.  Symmetrical.  Speech is fluent. Motor exam as listed above. Psychiatric: Judgment intact, Mood & affect appropriate for pt's clinical situation. Dermatologic: No rashes or ulcers noted.  No changes consistent with cellulitis. Lymph : No Cervical lymphadenopathy, no lichenification or skin changes of chronic lymphedema.  CBC Lab Results  Component Value Date   WBC 6.6 12/13/2017   HGB 15.3 12/13/2017   HCT 45.2 12/13/2017   MCV 89 12/13/2017   PLT 215 12/13/2017    BMET    Component Value Date/Time   NA 143 02/10/2018 1005   K 4.3 02/10/2018 1005   CL 105 02/10/2018 1005   CO2 19  (L) 02/10/2018 1005   GLUCOSE 76 02/10/2018 1005   BUN 21 02/10/2018 1005   CREATININE 0.93 02/10/2018 1005   CALCIUM 9.5 02/10/2018 1005   GFRNONAA 92 02/10/2018 1005   GFRAA 106 02/10/2018 1005   CrCl cannot be calculated (Patient's most recent lab result is older than the maximum 21 days allowed.).  COAG No results found for: INR, PROTIME  Radiology No results found.   Assessment/Plan 1. Abdominal aortic aneurysm (AAA) without rupture ([place]) No surgery or intervention at this time. The patient has an asymptomatic abdominal aortic aneurysm that appears to be less than 5 cm in maximal diameter.  I have discussed the natural history of abdominal aortic aneurysm and the small risk of  rupture for aneurysm less than 5 cm in size.  However, as these small aneurysms tend to enlarge over time, continued surveillance with ultrasound or CT scan is mandatory. He needs a duplex ultrasound to establish the true diameter of his AAA.  I have also discussed optimizing medical management with hypertension and lipid control and the importance of abstinence from tobacco.  The patient is also encouraged to exercise a minimum of 30 minutes 4 times a week.   Should the patient develop new onset abdominal or back pain or signs of peripheral embolization they are instructed to seek medical attention immediately and to alert the physician providing care that they have an aneurysm.   - VAS US AORTA/IVC/ILIACS; Future  2. Essential hypertension Continue antihypertensive medications as already ordered, these medications have been reviewed and there are no changes at this time.   3. Gastroesophageal reflux disease, esophagitis presence not specified Continue PPI as already ordered, this medication has been reviewed and there are no changes at this time.  Avoidence of caffeine and alcohol  Moderate elevation of the head of the bed    Hortencia Pilar, MD  05/02/2018 3:17 PM

## 2018-05-09 ENCOUNTER — Ambulatory Visit (INDEPENDENT_AMBULATORY_CARE_PROVIDER_SITE_OTHER): Payer: BC Managed Care – PPO | Admitting: Vascular Surgery

## 2018-05-09 ENCOUNTER — Other Ambulatory Visit (INDEPENDENT_AMBULATORY_CARE_PROVIDER_SITE_OTHER): Payer: BC Managed Care – PPO

## 2018-05-12 ENCOUNTER — Encounter (INDEPENDENT_AMBULATORY_CARE_PROVIDER_SITE_OTHER): Payer: Self-pay | Admitting: Vascular Surgery

## 2018-05-12 ENCOUNTER — Ambulatory Visit (INDEPENDENT_AMBULATORY_CARE_PROVIDER_SITE_OTHER): Payer: BC Managed Care – PPO

## 2018-05-12 ENCOUNTER — Ambulatory Visit (INDEPENDENT_AMBULATORY_CARE_PROVIDER_SITE_OTHER): Payer: BC Managed Care – PPO | Admitting: Vascular Surgery

## 2018-05-12 ENCOUNTER — Other Ambulatory Visit: Payer: Self-pay

## 2018-05-12 VITALS — BP 196/123 | HR 66 | Resp 17 | Ht 69.5 in | Wt 171.0 lb

## 2018-05-12 DIAGNOSIS — I714 Abdominal aortic aneurysm, without rupture, unspecified: Secondary | ICD-10-CM

## 2018-05-12 DIAGNOSIS — Z791 Long term (current) use of non-steroidal anti-inflammatories (NSAID): Secondary | ICD-10-CM

## 2018-05-12 DIAGNOSIS — F17210 Nicotine dependence, cigarettes, uncomplicated: Secondary | ICD-10-CM

## 2018-05-12 DIAGNOSIS — M4726 Other spondylosis with radiculopathy, lumbar region: Secondary | ICD-10-CM

## 2018-05-12 DIAGNOSIS — Z79899 Other long term (current) drug therapy: Secondary | ICD-10-CM

## 2018-05-12 DIAGNOSIS — K219 Gastro-esophageal reflux disease without esophagitis: Secondary | ICD-10-CM

## 2018-05-12 DIAGNOSIS — I10 Essential (primary) hypertension: Secondary | ICD-10-CM

## 2018-05-12 NOTE — Progress Notes (Signed)
MRN : [id number]  Daniel Mcneil is a 58 y.o. ([birth year]) male who presents with chief complaint of  Chief Complaint  Patient presents with   Follow-up    ultrasound follow up  .  History of Present Illness:   The patient returns to the office for surveillance of a known abdominal aortic aneurysm. Patient denies abdominal pain or back pain, no other abdominal complaints. No changes suggesting embolic episodes.   There have been no interval changes in the patient's overall health care since his last visit.  Patient denies amaurosis fugax or TIA symptoms. There is no history of claudication or rest pain symptoms of the lower extremities. The patient denies angina or shortness of breath.   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  Current Meds  Medication Sig   Aspirin-Salicylamide-Caffeine (BC HEADACHE POWDER PO) Take by mouth as needed.   baclofen (LIORESAL) 10 MG tablet Take 1 tablet (10 mg total) by mouth at bedtime.   dexlansoprazole (DEXILANT) 60 MG capsule Take 1 capsule (60 mg total) by mouth daily.   gabapentin (NEURONTIN) 300 MG capsule TAKE 1 CAPSULE(300 MG) BY MOUTH THREE TIMES DAILY   lisinopril (PRINIVIL,ZESTRIL) 10 MG tablet Take 1.5 tablets (15 mg total) by mouth daily.   Multiple Vitamins-Minerals (EQ MULTIVITAMINS ADULT GUMMY PO) Take by mouth daily.   naproxen (NAPROSYN) 500 MG tablet Take 1 tablet (500 mg total) by mouth 2 (two) times daily with a meal.   varenicline (CHANTIX CONTINUING MONTH PAK) 1 MG tablet Take 1 tablet (1 mg total) by mouth 2 (two) times daily.    Past Medical History:  Diagnosis Date   Chronic kidney disease, stage I    Dysphagia    GERD (gastroesophageal reflux disease)    Herniated disc, cervical    Hypertension    Hypertensive nephropathy    Lumbar herniated disc    Tobacco use disorder    Urinary hesitancy     Past Surgical History:  Procedure  Laterality Date   BIOPSY [name] 04/10/2016   Procedure: BIOPSY;  Surgeon: [name], MD;  Location: [place];  Service: Endoscopy;  Laterality: [name];   COLONOSCOPY WITH PROPOFOL [name] 04/10/2016   Procedure: COLONOSCOPY WITH PROPOFOL;  Surgeon: [name], MD;  Location: [place];  Service: Endoscopy;  Laterality: [name];  requests - not first   HERNIA REPAIR      Social History Social History   Tobacco Use   Smoking status: Current Every Day Smoker    Packs/day: 1.00    Years: 36.00    Pack years: 36.00    Types: Cigarettes   Smokeless tobacco: Never Used   Tobacco comment: since age 4.  Substance Use Topics   Alcohol use: Yes    Alcohol/week: 15.0 standard drinks    Types: 15 Cans of beer per week    Comment:     Drug use: No    Family History Family History  Problem Relation Age of Onset   Stroke Mother    Hypertension Mother    Cancer Father    Hypertension Father    Diabetes Father    Heart disease Father    Cancer Maternal Grandfather    Hypertension Sister    Cancer Sister        Lung cancer    Allergies  Allergen Reactions   Flomax [Tamsulosin]     dizziness  REVIEW OF SYSTEMS (Negative unless checked)  Constitutional: [] Weight loss  [] Fever  [] Chills Cardiac: [] Chest pain   [] Chest pressure   [] Palpitations   [] Shortness of breath when laying flat   [] Shortness of breath with exertion. Vascular:  [] Pain in legs with walking   [] Pain in legs at rest  [] History of DVT   [] Phlebitis   [] Swelling in legs   [] Varicose veins   [] Non-healing ulcers Pulmonary:   [] Uses home oxygen   [] Productive cough   [] Hemoptysis   [] Wheeze  [] COPD   [] Asthma Neurologic:  [] Dizziness   [] Seizures   [] History of stroke   [] History of TIA  [] Aphasia   [] Vissual changes   [] Weakness or numbness in arm   [x] Weakness or numbness in leg Musculoskeletal:   [] Joint swelling   [] Joint pain   [] Low back pain Hematologic:  [] Easy bruising  [] Easy  bleeding   [] Hypercoagulable state   [] Anemic Gastrointestinal:  [] Diarrhea   [] Vomiting  [] Gastroesophageal reflux/heartburn   [] Difficulty swallowing. Genitourinary:  [] Chronic kidney disease   [] Difficult urination  [] Frequent urination   [] Blood in urine Skin:  [] Rashes   [] Ulcers  Psychological:  [] History of anxiety   []  History of major depression.  Physical Examination  Vitals:   05/12/18 1051  BP: (!) 196/123  Pulse: 66  Resp: 17  Weight: 171 lb (77.6 kg)  Height: 5' 9.5" (1.765 m)   Body mass index is 24.89 kg/m. Gen: WD/WN, NAD Head: [place]/AT, No temporalis wasting.  Ear/Nose/Throat: Hearing grossly intact, nares w/o erythema or drainage Eyes: PER, EOMI, sclera nonicteric.  Neck: Supple, no large masses.   Pulmonary:  Good air movement, no audible wheezing bilaterally, no use of accessory muscles.  Cardiac: RRR, no JVD Vascular:  Vessel Right Left  Radial Palpable Palpable  Gastrointestinal: Non-distended. No guarding/no peritoneal signs.  Musculoskeletal: M/S 5/5 throughout.  No deformity or atrophy.  Neurologic: CN 2-12 intact. Symmetrical.  Speech is fluent. Motor exam as listed above. Psychiatric: Judgment intact, Mood & affect appropriate for pt's clinical situation. Dermatologic: No rashes or ulcers noted.  No changes consistent with cellulitis. Lymph : No lichenification or skin changes of chronic lymphedema.  CBC Lab Results  Component Value Date   WBC 6.6 12/13/2017   HGB 15.3 12/13/2017   HCT 45.2 12/13/2017   MCV 89 12/13/2017   PLT 215 12/13/2017    BMET    Component Value Date/Time   NA 143 02/10/2018 1005   K 4.3 02/10/2018 1005   CL 105 02/10/2018 1005   CO2 19 (L) 02/10/2018 1005   GLUCOSE 76 02/10/2018 1005   BUN 21 02/10/2018 1005   CREATININE 0.93 02/10/2018 1005   CALCIUM 9.5 02/10/2018 1005   GFRNONAA 92 02/10/2018 1005   GFRAA 106 02/10/2018 1005   CrCl cannot be calculated (Patient's most recent lab result is older than the  maximum 21 days allowed.).  COAG No results found for: INR, PROTIME  Radiology No results found.   Assessment/Plan 1. Abdominal aortic aneurysm (AAA) without rupture ([place]) No surgery or intervention at this time. The patient has an asymptomatic abdominal aortic aneurysm that is less than 4 cm in maximal diameter.  I have discussed the natural history of abdominal aortic aneurysm and the small risk of rupture for aneurysm less than 5 cm in size.  However, as these small aneurysms tend to enlarge over time, continued surveillance with ultrasound or CT scan is mandatory.  I have also discussed optimizing medical management with hypertension and lipid  control and the importance of abstinence from tobacco.  The patient is also encouraged to exercise a minimum of 30 minutes 4 times a week.  Should the patient develop new onset abdominal or back pain or signs of peripheral embolization they are instructed to seek medical attention immediately and to alert the physician providing care that they have an aneurysm.  The patient voices their understanding. The patient will return in 12 months with an aortic duplex.  - VAS US AORTA/IVC/ILIACS; Future  2. Essential hypertension Continue antihypertensive medications as already ordered, these medications have been reviewed and there are no changes at this time.   3. Gastroesophageal reflux disease, esophagitis presence not specified Continue PPI as already ordered, this medication has been reviewed and there are no changes at this time.  Avoidence of caffeine and alcohol  Moderate elevation of the head of the bed   4. Osteoarthritis of spine with radiculopathy, lumbar region Continue NSAID medications as already ordered, these medications have been reviewed and there are no changes at this time.  Continued activity and therapy was stressed.     Hortencia Pilar, MD  05/12/2018 3:20 PM

## 2018-05-19 ENCOUNTER — Other Ambulatory Visit: Payer: Self-pay | Admitting: Family Medicine

## 2018-06-14 ENCOUNTER — Other Ambulatory Visit: Payer: Self-pay | Admitting: Family Medicine

## 2018-09-12 ENCOUNTER — Other Ambulatory Visit: Payer: Self-pay | Admitting: Family Medicine

## 2018-12-08 ENCOUNTER — Other Ambulatory Visit: Payer: Self-pay | Admitting: Family Medicine

## 2018-12-08 NOTE — Telephone Encounter (Signed)
Needs appointment

## 2018-12-08 NOTE — Telephone Encounter (Signed)
Forwarding medication refill request to the PCP for review.

## 2018-12-08 NOTE — Telephone Encounter (Signed)
LVM ON 12/08/2018

## 2018-12-09 ENCOUNTER — Other Ambulatory Visit: Payer: Self-pay

## 2018-12-09 ENCOUNTER — Encounter: Payer: Self-pay | Admitting: Family Medicine

## 2018-12-09 ENCOUNTER — Ambulatory Visit (INDEPENDENT_AMBULATORY_CARE_PROVIDER_SITE_OTHER): Payer: BC Managed Care – PPO | Admitting: Family Medicine

## 2018-12-09 VITALS — BP 147/87 | HR 89

## 2018-12-09 DIAGNOSIS — Z125 Encounter for screening for malignant neoplasm of prostate: Secondary | ICD-10-CM

## 2018-12-09 DIAGNOSIS — I714 Abdominal aortic aneurysm, without rupture, unspecified: Secondary | ICD-10-CM

## 2018-12-09 DIAGNOSIS — K219 Gastro-esophageal reflux disease without esophagitis: Secondary | ICD-10-CM | POA: Diagnosis not present

## 2018-12-09 DIAGNOSIS — I129 Hypertensive chronic kidney disease with stage 1 through stage 4 chronic kidney disease, or unspecified chronic kidney disease: Secondary | ICD-10-CM

## 2018-12-09 DIAGNOSIS — M4726 Other spondylosis with radiculopathy, lumbar region: Secondary | ICD-10-CM

## 2018-12-09 MED ORDER — DEXILANT 60 MG PO CPDR: 60.0000 mg | DELAYED_RELEASE_CAPSULE | Freq: Every day | ORAL | 3 refills | Status: DC

## 2018-12-09 MED ORDER — LISINOPRIL 20 MG PO TABS: ORAL_TABLET | ORAL | 1 refills | Status: DC

## 2018-12-09 MED ORDER — NAPROXEN 500 MG PO TABS: 500.0000 mg | ORAL_TABLET | Freq: Two times a day (BID) | ORAL | 1 refills | Status: DC

## 2018-12-09 NOTE — Assessment & Plan Note (Signed)
Discussed with patient. Due for recheck on [place] in April. Call with any concerns. Continue to monitor.

## 2018-12-09 NOTE — Assessment & Plan Note (Signed)
Not under good control. Will increase his lisinopril to 20mg  and recheck 1 month. Call with any concerns.

## 2018-12-09 NOTE — Assessment & Plan Note (Signed)
Well controlled on dexilant. Has failed omeprazole and nexium in the past. Continue dexilant- may need PA. Call with any concerns.

## 2018-12-09 NOTE — Progress Notes (Signed)
BP (!) 147/87   Pulse 89    Subjective:    Patient ID: Daniel Mcneil, male    DOB: 04/22/1960, 58 y.o.   MRN: [id number]  HPI: Daniel Mcneil is a 58 y.o. male  Chief Complaint  Patient presents with  . Hypertension  . aneurysm   HYPERTENSION Hypertension status: uncontrolled  Satisfied with current treatment? yes Duration of hypertension: chronic BP monitoring frequency:  rarely BP medication side effects:  no Medication compliance: excellent compliance Previous BP meds: lisinopril Aspirin: no Recurrent headaches: no Visual changes: no Palpitations: no Dyspnea: no Chest pain: no Lower extremity edema: no Dizzy/lightheaded: no   GERD GERD control status: controlled  Satisfied with current treatment? yes Heartburn frequency: not on medicine Medication side effects: no  Medication compliance: excellent Dysphagia: yes Odynophagia:  no Hematemesis: no Blood in stool: no EGD: yes  Diagnosed with AAA in April. Has been concerned about this.   Back pain continues- still has aching and shooting, numb pain down his leg. Offered facet joint in the past- has not gotten set up with PM&R yet. No other concerns or complaints at this time.   Relevant past medical, surgical, family and social history reviewed and updated as indicated. Interim medical history since our last visit reviewed. Allergies and medications reviewed and updated.  Review of Systems  Constitutional: Negative.   Respiratory: Negative.   Cardiovascular: Negative.   Gastrointestinal: Negative.   Musculoskeletal: Positive for back pain and myalgias. Negative for arthralgias, gait problem, joint swelling, neck pain and neck stiffness.  Skin: Negative.   Neurological: Positive for numbness. Negative for dizziness, tremors, seizures, syncope, facial asymmetry, speech difficulty, weakness, light-headedness and headaches.  Psychiatric/Behavioral: Negative.     Per HPI unless specifically indicated above   Objective:    BP (!) 147/87   Pulse 89   Wt Readings from Last 3 Encounters:  05/12/18 171 lb (77.6 kg)  05/02/18 169 lb (76.7 kg)  04/20/18 161 lb 2 oz (73.1 kg)    Physical Exam Vitals signs and nursing note reviewed.  Constitutional:      General: He is not in acute distress.    Appearance: Normal appearance. He is not ill-appearing, toxic-appearing or diaphoretic.  HENT:     Head: Normocephalic and atraumatic.     Right Ear: External ear normal.     Left Ear: External ear normal.     Nose: Nose normal.     Mouth/Throat:     Mouth: Mucous membranes are moist.     Pharynx: Oropharynx is clear.  Eyes:     General: No scleral icterus.       Right eye: No discharge.        Left eye: No discharge.     Conjunctiva/sclera: Conjunctivae normal.     Pupils: Pupils are equal, round, and reactive to light.  Neck:     Musculoskeletal: Normal range of motion.  Pulmonary:     Effort: Pulmonary effort is normal. No respiratory distress.     Comments: Speaking in full sentences Musculoskeletal: Normal range of motion.  Skin:    Coloration: Skin is not jaundiced or pale.     Findings: No bruising, erythema, lesion or rash.  Neurological:     Mental Status: He is alert and oriented to person, place, and time. Mental status is at baseline.  Psychiatric:        Mood and Affect: Mood normal.        Behavior: Behavior normal.  Thought Content: Thought content normal.        Judgment: Judgment normal.     Results for orders placed or performed in visit on 10/62/69  Basic metabolic panel  Result Value Ref Range   Glucose 76 65 - 99 mg/dL   BUN 21 6 - 24 mg/dL   Creatinine, Ser 0.93 0.76 - 1.27 mg/dL   GFR calc non Af Amer 92 >59 mL/min/1.73   GFR calc Af Amer 106 >59 mL/min/1.73   BUN/Creatinine Ratio 23 (H) 9 - 20   Sodium 143 134 - 144 mmol/L   Potassium 4.3 3.5 - 5.2 mmol/L   Chloride 105 96 - 106 mmol/L   CO2 19 (L) 20 - 29 mmol/L   Calcium 9.5 8.7 - 10.2 mg/dL       Assessment & Plan:   Problem List Items Addressed This Visit      Cardiovascular and Mediastinum   Abdominal aortic aneurysm (AAA) without rupture [facility])    Discussed with patient. Due for recheck on [place] in April. Call with any concerns. Continue to monitor.       Relevant Medications   lisinopril (ZESTRIL) 20 MG tablet   Other Relevant Orders   Comp Met (CMET)   Lipid Panel w/o Chol/HDL Ratio OUT     Digestive   GERD (gastroesophageal reflux disease)    Well controlled on dexilant. Has failed omeprazole and nexium in the past. Continue dexilant- may need PA. Call with any concerns.       Relevant Medications   dexlansoprazole (DEXILANT) 60 MG capsule   Other Relevant Orders   CBC with Differential OUT   Comp Met (CMET)     Nervous and Auditory   Osteoarthritis of spine with radiculopathy, lumbar region    Will consider facet joint injection. Will let us know if he wants referral to PM&R. Call with any concerns. Continue to monitor.      Relevant Medications   naproxen (NAPROSYN) 500 MG tablet     Genitourinary   Benign hypertensive renal disease - Primary    Not under good control. Will increase his lisinopril to 70m and recheck 1 month. Call with any concerns.       Relevant Orders   Comp Met (CMET)   Microalbumin, Urine Waived   UA/M w/rflx Culture, Routine    Other Visit Diagnoses    Screening for prostate cancer       Will check labs. Await results.    Relevant Orders   PSA       Follow up plan: Return in about 4 weeks (around 01/06/2019) for Physical and recheck BP.   .Marland KitchenThis visit was completed via telephone due to the restrictions of the COVID-19 pandemic. All issues as above were discussed and addressed but no physical exam was performed. If it was felt that the patient should be evaluated in the office, they were directed there. The patient verbally consented to this visit. Patient was unable to complete an audio/visual visit due to Lack of equipment.  Due to the catastrophic nature of the COVID-19 pandemic, this visit was done through audio contact only. . Location of the patient: home . Location of the provider: home . Those involved with this call:  . Provider: MPark Liter DO . CMA: Tiffany Reel, CMA . Front Desk/Registration: CDon Perking . Time spent on call: 25 minutes with patient face to face via video conference. More than 50% of this time was spent in counseling and coordination of  care. 40 minutes total spent in review of patient's record and preparation of their chart.

## 2018-12-09 NOTE — Patient Instructions (Signed)
Radicular Pain Radicular pain is a type of pain that spreads from your back or neck along a spinal nerve. Spinal nerves are nerves that leave the spinal cord and go to the muscles. Radicular pain is sometimes called radiculopathy, radiculitis, or a pinched nerve. When you have this type of pain, you may also have weakness, numbness, or tingling in the area of your body that is supplied by the nerve. The pain may feel sharp and burning. Depending on which spinal nerve is affected, the pain may occur in the:  Neck area (cervical radicular pain). You may also feel pain, numbness, weakness, or tingling in the arms.  Mid-spine area (thoracic radicular pain). You would feel this pain in the back and chest. This type is rare.  Lower back area (lumbar radicular pain). You would feel this pain as low back pain. You may feel pain, numbness, weakness, or tingling in the buttocks or legs. Sciatica is a type of lumbar radicular pain that shoots down the back of the leg. Radicular pain occurs when one of the spinal nerves becomes irritated or squeezed (compressed). It is often caused by something pushing on a spinal nerve, such as one of the bones of the spine (vertebrae) or one of the round cushions between vertebrae (intervertebral disks). This can result from:  An injury.  Wear and tear or aging of a disk.  The growth of a bone spur that pushes on the nerve. Radicular pain often goes away when you follow instructions from your health care provider for relieving pain at home. Follow these instructions at home: Managing pain      If directed, put ice on the affected area: ? Put ice in a plastic bag. ? Place a towel between your skin and the bag. ? Leave the ice on for 20 minutes, 2-3 times a day.  If directed, apply heat to the affected area as often as told by your health care provider. Use the heat source that your health care provider recommends, such as a moist heat pack or a heating pad. ? Place  a towel between your skin and the heat source. ? Leave the heat on for 20-30 minutes. ? Remove the heat if your skin turns bright red. This is especially important if you are unable to feel pain, heat, or cold. You may have a greater risk of getting burned. Activity   Do not sit or rest in bed for long periods of time.  Try to stay as active as possible. Ask your health care provider what type of exercise or activity is best for you.  Avoid activities that make your pain worse, such as bending and lifting.  Do not lift anything that is heavier than 10 lb (4.5 kg), or the limit that you are told, until your health care provider says that it is safe.  Practice using proper technique when lifting items. Proper lifting technique involves bending your knees and rising up.  Do strength and range-of-motion exercises only as told by your health care provider or physical therapist. General instructions  Take over-the-counter and prescription medicines only as told by your health care provider.  Pay attention to any changes in your symptoms.  Keep all follow-up visits as told by your health care provider. This is important. ? Your health care provider may send you to a physical therapist to help with this pain. Contact a health care provider if:  Your pain and other symptoms get worse.  Your pain medicine is not   helping.  Your pain has not improved after a few weeks of home care.  You have a fever. Get help right away if:  You have severe pain, weakness, or numbness.  You have difficulty with bladder or bowel control. Summary  Radicular pain is a type of pain that spreads from your back or neck along a spinal nerve.  When you have radicular pain, you may also have weakness, numbness, or tingling in the area of your body that is supplied by the nerve.  The pain may feel sharp or burning.  Radicular pain may be treated with ice, heat, medicines, or physical therapy. This  information is not intended to replace advice given to you by your health care provider. Make sure you discuss any questions you have with your health care provider. Document Released: 02/20/2004 Document Revised: 07/27/2017 Document Reviewed: 07/27/2017 Elsevier Patient Education  [place].  Facet Joint Block The facet joints connect the bones of the spine (vertebrae). They make it possible for you to bend, twist, and make other movements with your spine. They also keep you from bending too far, twisting too far, and making other extreme movements. A facet joint block is a procedure in which a numbing medicine (anesthetic) is injected into a facet joint. In many cases, an anti-inflammatory medicine (steroid) is also injected. A facet joint block may be done:  To diagnose neck or back pain. If the pain gets better after a facet joint block, it means the pain is probably coming from the facet joint. If the pain does not get better, it means the pain is probably not coming from the facet joint.  To relieve neck or back pain that is caused by an inflamed facet joint. A facet joint block is only done to relieve pain if the pain does not improve with other methods, such as medicine, exercise programs, and physical therapy. Tell a health care provider about:  Any allergies you have.  All medicines you are taking, including vitamins, herbs, eye drops, creams, and over-the-counter medicines.  Any problems you or family members have had with anesthetic medicines.  Any blood disorders you have.  Any surgeries you have had.  Any medical conditions you have or have had.  Whether you are pregnant or may be pregnant. What are the risks? Generally, this is a safe procedure. However, problems may occur, including:  Bleeding.  Injury to a nerve near the injection site.  Pain at the injection site.  Weakness or numbness in areas controlled by nerves near the injection site.  Infection.   Temporary fluid retention.  Allergic reactions to medicines or dyes.  Injury to other structures or organs near the injection site. What happens before the procedure? Medicines Ask your health care provider about:  Changing or stopping your regular medicines. This is especially important if you are taking diabetes medicines or blood thinners.  Taking medicines such as aspirin and ibuprofen. These medicines can thin your blood. Do not take these medicines unless your health care provider tells you to take them.  Taking over-the-counter medicines, vitamins, herbs, and supplements. Eating and drinking Follow instructions from your health care provider about eating and drinking, which may include:  8 hours before the procedure - stop eating heavy meals or foods, such as meat, fried foods, or fatty foods.  6 hours before the procedure - stop eating light meals or foods, such as toast or cereal.  6 hours before the procedure - stop drinking milk or drinks  that contain milk.  2 hours before the procedure - stop drinking clear liquids. Staying hydrated Follow instructions from your health care provider about hydration, which may include:  Up to 2 hours before the procedure - you may continue to drink clear liquids, such as water, clear fruit juice, black coffee, and plain tea. General instructions  Do not use any products that contain nicotine or tobacco for at least 4-6 weeks before the procedure. These products include cigarettes, e-cigarettes, and chewing tobacco. If you need help quitting, ask your health care provider.  Plan to have someone take you home from the hospital or clinic.  Ask your health care provider: ? How your surgery site will be marked. ? What steps will be taken to help prevent infection. These may include:  Removing hair at the surgery site.  Washing skin with a germ-killing soap.  Receiving antibiotic medicine. What happens during the procedure?   You  will put on a hospital gown.  You will lie on your stomach on an X-ray table. You may be asked to lie in a different position if an injection will be made in your neck.  Machines will be used to monitor your oxygen levels, heart rate, and blood pressure.  Your skin will be cleaned.  If an injection will be made in your neck, an IV will be inserted into one of your veins. Fluids and medicine will flow directly into your body through the IV.  A numbing medicine (local anesthetic) will be applied to your skin. Your skin may sting or burn for a moment.  A video X-ray machine (fluoroscopy) will be used to find the joint. In some cases, a CT scan may be used.  A contrast dye may be injected into the facet joint area to help find the joint.  When the joint is located, an anesthetic will be injected into the joint through the needle.  Your health care provider will ask you whether you feel pain relief. ? If you feel relief, a steroid may be injected to provide pain relief for a longer period of time. ? If you do not feel relief or feel only partial relief, additional injections of an anesthetic may be made in other facet joints.  The needle will be removed.  Your skin will be cleaned.  A bandage (dressing) will be applied over each injection site. The procedure may vary among health care providers and hospitals. What happens after the procedure?  Your blood pressure, heart rate, breathing rate, and blood oxygen level will be monitored until you leave the hospital or clinic.  You will lie down and rest for a period of time. Summary  A facet joint block is a procedure in which a numbing medicine (anesthetic) is injected into a facet joint. An anti-inflammatory medicine (stereoid) may also be injected.  Follow instructions from your health care provider about medicines and eating and drinking before the procedure.  Do not use any products that contain nicotine or tobacco for at least 4-6  weeks before the procedure.  You will lie on your stomach for the procedure, but you may be asked to lie in a different position if an injection will be made in your neck.  When the joint is located, an anesthetic will be injected into the joint through the needle. This information is not intended to replace advice given to you by your health care provider. Make sure you discuss any questions you have with your health care provider. Document Released:  06/03/2006 Document Revised: 05/05/2018 Document Reviewed: 12/17/2017 Elsevier Patient Education  [place].

## 2018-12-09 NOTE — Assessment & Plan Note (Signed)
Will consider facet joint injection. Will let us know if he wants referral to PM&R. Call with any concerns. Continue to monitor.

## 2018-12-13 ENCOUNTER — Other Ambulatory Visit: Payer: Self-pay

## 2018-12-13 ENCOUNTER — Other Ambulatory Visit: Payer: BC Managed Care – PPO

## 2018-12-13 ENCOUNTER — Ambulatory Visit (INDEPENDENT_AMBULATORY_CARE_PROVIDER_SITE_OTHER): Payer: BC Managed Care – PPO

## 2018-12-13 DIAGNOSIS — Z23 Encounter for immunization: Secondary | ICD-10-CM

## 2018-12-13 DIAGNOSIS — I129 Hypertensive chronic kidney disease with stage 1 through stage 4 chronic kidney disease, or unspecified chronic kidney disease: Secondary | ICD-10-CM

## 2018-12-13 DIAGNOSIS — Z125 Encounter for screening for malignant neoplasm of prostate: Secondary | ICD-10-CM

## 2018-12-13 DIAGNOSIS — K219 Gastro-esophageal reflux disease without esophagitis: Secondary | ICD-10-CM

## 2018-12-13 DIAGNOSIS — I714 Abdominal aortic aneurysm, without rupture, unspecified: Secondary | ICD-10-CM

## 2018-12-13 LAB — MICROALBUMIN, URINE WAIVED
Creatinine, Urine Waived: 100 mg/dL (ref 10–300)
Microalb, Ur Waived: 150 mg/L — ABNORMAL HIGH (ref 0–19)

## 2018-12-13 LAB — MICROSCOPIC EXAMINATION
Bacteria, UA: NONE SEEN
WBC, UA: NONE SEEN /hpf (ref 0–5)

## 2018-12-13 LAB — UA/M W/RFLX CULTURE, ROUTINE
Bilirubin, UA: NEGATIVE
Glucose, UA: NEGATIVE
Ketones, UA: NEGATIVE
Leukocytes,UA: NEGATIVE
Nitrite, UA: NEGATIVE
Specific Gravity, UA: 1.025 (ref 1.005–1.030)
Urobilinogen, Ur: 0.2 mg/dL (ref 0.2–1.0)
pH, UA: 5 (ref 5.0–7.5)

## 2018-12-14 ENCOUNTER — Encounter: Payer: Self-pay | Admitting: Family Medicine

## 2018-12-14 LAB — COMPREHENSIVE METABOLIC PANEL
ALT: 17 IU/L (ref 0–44)
AST: 21 IU/L (ref 0–40)
Albumin/Globulin Ratio: 1.6 (ref 1.2–2.2)
Albumin: 4.4 g/dL (ref 3.8–4.9)
Alkaline Phosphatase: 90 IU/L (ref 39–117)
BUN/Creatinine Ratio: 19 (ref 9–20)
BUN: 20 mg/dL (ref 6–24)
Bilirubin Total: <0.2 mg/dL (ref 0.0–1.2)
CO2: 19 mmol/L — ABNORMAL LOW (ref 20–29)
Calcium: 9.2 mg/dL (ref 8.7–10.2)
Chloride: 108 mmol/L — ABNORMAL HIGH (ref 96–106)
Creatinine, Ser: 1.04 mg/dL (ref 0.76–1.27)
GFR calc Af Amer: 93 mL/min/{1.73_m2} (ref >59)
GFR calc non Af Amer: 80 mL/min/{1.73_m2} (ref >59)
Globulin, Total: 2.8 g/dL (ref 1.5–4.5)
Glucose: 119 mg/dL — ABNORMAL HIGH (ref 65–99)
Potassium: 4 mmol/L (ref 3.5–5.2)
Sodium: 142 mmol/L (ref 134–144)
Total Protein: 7.2 g/dL (ref 6.0–8.5)

## 2018-12-14 LAB — CBC WITH DIFFERENTIAL/PLATELET
Basophils Absolute: 0 10*3/uL (ref 0.0–0.2)
Basos: 0 %
EOS (ABSOLUTE): 0.2 10*3/uL (ref 0.0–0.4)
Eos: 2 %
Hematocrit: 49.8 % (ref 37.5–51.0)
Hemoglobin: 17.1 g/dL (ref 13.0–17.7)
Immature Grans (Abs): 0 10*3/uL (ref 0.0–0.1)
Immature Granulocytes: 0 %
Lymphocytes Absolute: 1.3 10*3/uL (ref 0.7–3.1)
Lymphs: 14 %
MCH: 31.1 pg (ref 26.6–33.0)
MCHC: 34.3 g/dL (ref 31.5–35.7)
MCV: 91 fL (ref 79–97)
Monocytes Absolute: 0.8 10*3/uL (ref 0.1–0.9)
Monocytes: 8 %
Neutrophils Absolute: 7 10*3/uL (ref 1.4–7.0)
Neutrophils: 76 %
Platelets: 233 10*3/uL (ref 150–450)
RBC: 5.49 x10E6/uL (ref 4.14–5.80)
RDW: 13.6 % (ref 11.6–15.4)
WBC: 9.4 10*3/uL (ref 3.4–10.8)

## 2018-12-14 LAB — LIPID PANEL W/O CHOL/HDL RATIO
Cholesterol, Total: 212 mg/dL — ABNORMAL HIGH (ref 100–199)
HDL: 36 mg/dL — ABNORMAL LOW (ref >39)
LDL Chol Calc (NIH): 164 mg/dL — ABNORMAL HIGH (ref 0–99)
Triglycerides: 65 mg/dL (ref 0–149)
VLDL Cholesterol Cal: 12 mg/dL (ref 5–40)

## 2018-12-14 LAB — PSA: Prostate Specific Ag, Serum: 0.3 ng/mL (ref 0.0–4.0)

## 2019-01-11 ENCOUNTER — Other Ambulatory Visit: Payer: Self-pay

## 2019-01-12 ENCOUNTER — Ambulatory Visit (INDEPENDENT_AMBULATORY_CARE_PROVIDER_SITE_OTHER): Payer: BC Managed Care – PPO | Admitting: Family Medicine

## 2019-01-12 ENCOUNTER — Other Ambulatory Visit: Payer: Self-pay

## 2019-01-12 ENCOUNTER — Encounter: Payer: Self-pay | Admitting: Family Medicine

## 2019-01-12 VITALS — BP 121/83 | HR 70 | Temp 97.8°F | Ht 69.5 in | Wt 165.4 lb

## 2019-01-12 DIAGNOSIS — I714 Abdominal aortic aneurysm, without rupture, unspecified: Secondary | ICD-10-CM

## 2019-01-12 DIAGNOSIS — Z0000 Encounter for general adult medical examination without abnormal findings: Secondary | ICD-10-CM | POA: Diagnosis not present

## 2019-01-12 DIAGNOSIS — H9193 Unspecified hearing loss, bilateral: Secondary | ICD-10-CM

## 2019-01-12 DIAGNOSIS — M5416 Radiculopathy, lumbar region: Secondary | ICD-10-CM

## 2019-01-12 DIAGNOSIS — I129 Hypertensive chronic kidney disease with stage 1 through stage 4 chronic kidney disease, or unspecified chronic kidney disease: Secondary | ICD-10-CM | POA: Diagnosis not present

## 2019-01-12 DIAGNOSIS — F17200 Nicotine dependence, unspecified, uncomplicated: Secondary | ICD-10-CM

## 2019-01-12 DIAGNOSIS — K219 Gastro-esophageal reflux disease without esophagitis: Secondary | ICD-10-CM | POA: Diagnosis not present

## 2019-01-12 MED ORDER — BUPROPION HCL ER (SR) 150 MG PO TB12: ORAL_TABLET | ORAL | 3 refills | Status: DC

## 2019-01-12 NOTE — Assessment & Plan Note (Signed)
Worsening. Will refer to ortho. Continue to monitor. Unable to tolerate gabapentin due to ED. Call with any concerns.

## 2019-01-12 NOTE — Progress Notes (Signed)
BP 121/83 (BP Location: Left Arm, Patient Position: Sitting, Cuff Size: Normal)   Pulse 70   Temp 97.8 F (36.6 C) (Oral)   Ht 5' 9.5" (1.765 m)   Wt 165 lb 6.4 oz (75 kg)   SpO2 99%   BMI 24.08 kg/m    Subjective:    Patient ID: Daniel Mcneil, male    DOB: 07/12/1960, 58 y.o.   MRN: [id number]  HPI: Daniel Mcneil is a 58 y.o. male presenting on 01/12/2019 for comprehensive medical examination. Current medical complaints include:  HYPERTENSION Hypertension status: better  Satisfied with current treatment? yes Duration of hypertension: chronic BP monitoring frequency:  not checking BP medication side effects:  no Medication compliance: excellent compliance Previous BP meds: lisinopril Aspirin: no Recurrent headaches: no Visual changes: no Palpitations: no Dyspnea: no Chest pain: no Lower extremity edema: no Dizzy/lightheaded: no  He currently lives with: wife Interim Problems from his last visit: no  Depression Screen done today and results listed below:  Depression screen [facility] 2/9 02/10/2018 08/19/2016 07/08/2015  Decreased Interest 1 3 1   Down, Depressed, Hopeless 1 1 1   PHQ - 2 Score 2 4 2   Altered sleeping 1 1 1   Tired, decreased energy 1 3 1   Change in appetite 1 1 0  Feeling bad or failure about yourself  0 0 1  Trouble concentrating 0 0 0  Moving slowly or fidgety/restless 0 1 1  Suicidal thoughts 0 0 0  PHQ-9 Score 5 10 6   Difficult doing work/chores Somewhat difficult - -    Past Medical History:  Past Medical History:  Diagnosis Date  . Chronic kidney disease, stage I   . Dysphagia   . GERD (gastroesophageal reflux disease)   . Herniated disc, cervical   . Hypertension   . Hypertensive nephropathy   . Lumbar herniated disc   . Tobacco use disorder   . Urinary hesitancy     Surgical History:  Past Surgical History:  Procedure Laterality Date  . BIOPSY [name] 04/10/2016   Procedure: BIOPSY;  Surgeon: [name], MD;  Location: [place];  Service: Endoscopy;  Laterality: [name];  . COLONOSCOPY WITH PROPOFOL [name] 04/10/2016   Procedure: COLONOSCOPY WITH PROPOFOL;  Surgeon: [name], MD;  Location: [place];  Service: Endoscopy;  Laterality: [name];  requests - not first  . HERNIA REPAIR      Medications:  Current Outpatient Medications on File Prior to Visit  Medication Sig  . Aspirin-Salicylamide-Caffeine (BC HEADACHE POWDER PO) Take by mouth as needed.  Marland Kitchen dexlansoprazole (DEXILANT) 60 MG capsule Take 1 capsule (60 mg total) by mouth daily.  Marland Kitchen lisinopril (ZESTRIL) 20 MG tablet TAKE 1 TABLET BY MOUTH ONCE DAILY  . Multiple Vitamins-Minerals (EQ MULTIVITAMINS ADULT GUMMY PO) Take by mouth daily.  . naproxen (NAPROSYN) 500 MG tablet Take 1 tablet (500 mg total) by mouth 2 (two) times daily with a meal.   No current facility-administered medications on file prior to visit.    Allergies:  Allergies  Allergen Reactions  . Flomax [Tamsulosin]     dizziness    Social History:  Social History   Socioeconomic History  . Marital status: Married    Spouse name: Not on file  . Number of children: Not on file  . Years of education: Not on file  . Highest education level: Not on file  Occupational History  . Not on file  Tobacco Use  . Smoking status: Current Every Day Smoker  Packs/day: 1.00    Years: 36.00    Pack years: 36.00    Types: Cigarettes  . Smokeless tobacco: Never Used  . Tobacco comment: since age 21.  Substance and Sexual Activity  . Alcohol use: Yes    Alcohol/week: 15.0 standard drinks    Types: 15 Cans of beer per week    Comment:    . Drug use: No  . Sexual activity: Yes    Birth control/protection: None  Other Topics Concern  . Not on file  Social History Narrative  . Not on file   Social Determinants of Health   Financial Resource Strain:   . Difficulty of Paying Living Expenses: Not on file  Food Insecurity:   . Worried About Charity fundraiser in the Last Year: Not  on file  . Ran Out of Food in the Last Year: Not on file  Transportation Needs:   . Lack of Transportation (Medical): Not on file  . Lack of Transportation (Non-Medical): Not on file  Physical Activity:   . Days of Exercise per Week: Not on file  . Minutes of Exercise per Session: Not on file  Stress:   . Feeling of Stress : Not on file  Social Connections:   . Frequency of Communication with Friends and Family: Not on file  . Frequency of Social Gatherings with Friends and Family: Not on file  . Attends Religious Services: Not on file  . Active Member of Clubs or Organizations: Not on file  . Attends Archivist Meetings: Not on file  . Marital Status: Not on file  Intimate Partner Violence:   . Fear of Current or Ex-Partner: Not on file  . Emotionally Abused: Not on file  . Physically Abused: Not on file  . Sexually Abused: Not on file   Social History   Tobacco Use  Smoking Status Current Every Day Smoker  . Packs/day: 1.00  . Years: 36.00  . Pack years: 36.00  . Types: Cigarettes  Smokeless Tobacco Never Used  Tobacco Comment   since age 69.   Social History   Substance and Sexual Activity  Alcohol Use Yes  . Alcohol/week: 15.0 standard drinks  . Types: 15 Cans of beer per week   Comment:      Family History:  Family History  Problem Relation Age of Onset  . Stroke Mother   . Hypertension Mother   . Cancer Father   . Hypertension Father   . Diabetes Father   . Heart disease Father   . Cancer Maternal Grandfather   . Hypertension Sister   . Cancer Sister        Lung cancer    Past medical history, surgical history, medications, allergies, family history and social history reviewed with patient today and changes made to appropriate areas of the chart.   Review of Systems  Constitutional: Negative.   HENT: Positive for congestion and hearing loss. Negative for ear discharge, ear pain, nosebleeds, sinus pain, sore throat and tinnitus.   Eyes:  Positive for blurred vision. Negative for double vision, photophobia, pain, discharge and redness.  Respiratory: Positive for cough and wheezing. Negative for hemoptysis, sputum production, shortness of breath and stridor.   Cardiovascular: Negative.        Occasional chest tightness  Gastrointestinal: Negative.   Musculoskeletal: Positive for back pain and myalgias. Negative for falls, joint pain and neck pain.  Skin: Negative.   Neurological: Positive for tingling. Negative for dizziness, tremors, sensory  change, speech change, focal weakness, seizures, loss of consciousness, weakness and headaches.       Pressure in his L forehead  Endo/Heme/Allergies: Positive for environmental allergies (thinks hes allergic to one of his dogs). Negative for polydipsia. Does not bruise/bleed easily.  Psychiatric/Behavioral: Negative.     All other ROS negative except what is listed above and in the HPI.      Objective:    BP 121/83 (BP Location: Left Arm, Patient Position: Sitting, Cuff Size: Normal)   Pulse 70   Temp 97.8 F (36.6 C) (Oral)   Ht 5' 9.5" (1.765 m)   Wt 165 lb 6.4 oz (75 kg)   SpO2 99%   BMI 24.08 kg/m   Wt Readings from Last 3 Encounters:  01/12/19 165 lb 6.4 oz (75 kg)  05/12/18 171 lb (77.6 kg)  05/02/18 169 lb (76.7 kg)    Physical Exam Vitals and nursing note reviewed.  Constitutional:      General: He is not in acute distress.    Appearance: Normal appearance. He is normal weight. He is not ill-appearing, toxic-appearing or diaphoretic.  HENT:     Head: Normocephalic and atraumatic.     Right Ear: Tympanic membrane, ear canal and external ear normal. There is no impacted cerumen.     Left Ear: Tympanic membrane, ear canal and external ear normal. There is no impacted cerumen.     Nose: Nose normal. No congestion or rhinorrhea.     Mouth/Throat:     Mouth: Mucous membranes are moist.     Pharynx: Oropharynx is clear. No oropharyngeal exudate or posterior  oropharyngeal erythema.  Eyes:     General: No scleral icterus.       Right eye: No discharge.        Left eye: No discharge.     Extraocular Movements: Extraocular movements intact.     Conjunctiva/sclera: Conjunctivae normal.     Pupils: Pupils are equal, round, and reactive to light.  Neck:     Vascular: No carotid bruit.  Cardiovascular:     Rate and Rhythm: Normal rate and regular rhythm.     Pulses: Normal pulses.     Heart sounds: No murmur. No friction rub. No gallop.   Pulmonary:     Effort: Pulmonary effort is normal. No respiratory distress.     Breath sounds: Normal breath sounds. No stridor. No wheezing, rhonchi or rales.  Chest:     Chest wall: No tenderness.  Abdominal:     General: Abdomen is flat. Bowel sounds are normal. There is no distension.     Palpations: Abdomen is soft. There is no mass.     Tenderness: There is no abdominal tenderness. There is no right CVA tenderness, left CVA tenderness, guarding or rebound.     Hernia: No hernia is present.  Genitourinary:    Comments: Genital exam deferred with shared decision making Musculoskeletal:        General: No swelling, tenderness, deformity or signs of injury.     Cervical back: Normal range of motion and neck supple. No rigidity. No muscular tenderness.     Right lower leg: No edema.     Left lower leg: No edema.  Lymphadenopathy:     Cervical: No cervical adenopathy.  Skin:    General: Skin is warm and dry.     Capillary Refill: Capillary refill takes less than 2 seconds.     Coloration: Skin is not jaundiced or pale.  Findings: No bruising, erythema, lesion or rash.  Neurological:     General: No focal deficit present.     Mental Status: He is alert and oriented to person, place, and time.     Cranial Nerves: No cranial nerve deficit.     Sensory: No sensory deficit.     Motor: No weakness.     Coordination: Coordination normal.     Gait: Gait normal.     Deep Tendon Reflexes: Reflexes  normal.  Psychiatric:        Mood and Affect: Mood normal.        Behavior: Behavior normal.        Thought Content: Thought content normal.        Judgment: Judgment normal.     Results for orders placed or performed in visit on 12/13/18  Microscopic Examination   URINE  Result Value Ref Range   WBC, UA None seen 0 - 5 /hpf   RBC 0-2 0 - 2 /hpf   Epithelial Cells (non renal) 0-10 0 - 10 /hpf   Bacteria, UA None seen None seen/Few  CBC with Differential OUT  Result Value Ref Range   WBC 9.4 3.4 - 10.8 x10E3/uL   RBC 5.49 4.14 - 5.80 x10E6/uL   Hemoglobin 17.1 13.0 - 17.7 g/dL   Hematocrit 49.8 37.5 - 51.0 %   MCV 91 79 - 97 fL   MCH 31.1 26.6 - 33.0 pg   MCHC 34.3 31.5 - 35.7 g/dL   RDW 13.6 11.6 - 15.4 %   Platelets 233 150 - 450 x10E3/uL   Neutrophils 76 Not Estab. %   Lymphs 14 Not Estab. %   Monocytes 8 Not Estab. %   Eos 2 Not Estab. %   Basos 0 Not Estab. %   Neutrophils Absolute 7.0 1.4 - 7.0 x10E3/uL   Lymphocytes Absolute 1.3 0.7 - 3.1 x10E3/uL   Monocytes Absolute 0.8 0.1 - 0.9 x10E3/uL   EOS (ABSOLUTE) 0.2 0.0 - 0.4 x10E3/uL   Basophils Absolute 0.0 0.0 - 0.2 x10E3/uL   Immature Granulocytes 0 Not Estab. %   Immature Grans (Abs) 0.0 0.0 - 0.1 x10E3/uL  UA/M w/rflx Culture, Routine   Specimen: Urine   URINE  Result Value Ref Range   Specific Gravity, UA 1.025 1.005 - 1.030   pH, UA 5.0 5.0 - 7.5   Color, UA Yellow Yellow   Appearance Ur Clear Clear   Leukocytes,UA Negative Negative   Protein,UA 1+ (A) Negative/Trace   Glucose, UA Negative Negative   Ketones, UA Negative Negative   RBC, UA Trace (A) Negative   Bilirubin, UA Negative Negative   Urobilinogen, Ur 0.2 0.2 - 1.0 mg/dL   Nitrite, UA Negative Negative   Microscopic Examination See below:   PSA  Result Value Ref Range   Prostate Specific Ag, Serum 0.3 0.0 - 4.0 ng/mL  Microalbumin, Urine Waived  Result Value Ref Range   Microalb, Ur Waived 150 (H) 0 - 19 mg/L   Creatinine, Urine Waived  100 10 - 300 mg/dL   Microalb/Creat Ratio 30-300 (H) <30 mg/g  Lipid Panel w/o Chol/HDL Ratio OUT  Result Value Ref Range   Cholesterol, Total 212 (H) 100 - 199 mg/dL   Triglycerides 65 0 - 149 mg/dL   HDL 36 (L) >39 mg/dL   VLDL Cholesterol Cal 12 5 - 40 mg/dL   LDL Chol Calc (NIH) 164 (H) 0 - 99 mg/dL  Comp Met (CMET)  Result Value Ref Range  Glucose 119 (H) 65 - 99 mg/dL   BUN 20 6 - 24 mg/dL   Creatinine, Ser 1.04 0.76 - 1.27 mg/dL   GFR calc non Af Amer 80 >59 mL/min/1.73   GFR calc Af Amer 93 >59 mL/min/1.73   BUN/Creatinine Ratio 19 9 - 20   Sodium 142 134 - 144 mmol/L   Potassium 4.0 3.5 - 5.2 mmol/L   Chloride 108 (H) 96 - 106 mmol/L   CO2 19 (L) 20 - 29 mmol/L   Calcium 9.2 8.7 - 10.2 mg/dL   Total Protein 7.2 6.0 - 8.5 g/dL   Albumin 4.4 3.8 - 4.9 g/dL   Globulin, Total 2.8 1.5 - 4.5 g/dL   Albumin/Globulin Ratio 1.6 1.2 - 2.2   Bilirubin Total <0.2 0.0 - 1.2 mg/dL   Alkaline Phosphatase 90 39 - 117 IU/L   AST 21 0 - 40 IU/L   ALT 17 0 - 44 IU/L      Assessment & Plan:   Problem List Items Addressed This Visit      Cardiovascular and Mediastinum   Abdominal aortic aneurysm (AAA) without rupture ([place])    Continue to follow with vascular. Call with any concerns. Keep BP and cholesterol under good control.         Digestive   GERD (gastroesophageal reflux disease)    Under good control on current regimen. Continue current regimen. Continue to monitor. Call with any concerns. Refills given last visit.          Nervous and Auditory   Lumbar radiculopathy    Worsening. Will refer to ortho. Continue to monitor. Unable to tolerate gabapentin due to ED. Call with any concerns.       Relevant Medications   buPROPion (WELLBUTRIN SR) 150 MG 12 hr tablet   Other Relevant Orders   Ambulatory referral to Orthopedic Surgery     Genitourinary   Benign hypertensive renal disease    Under good control on current regimen. Continue current regimen. Continue to  monitor. Call with any concerns. Refills given. Labs drawn today       Relevant Orders   Basic Metabolic Panel (BMET)     Other   Tobacco use disorder    Had to stop his chantix. Still smoking. Will start him on wellbutrin. Call with any concerns. Continue to monitor. REcheck 1 month.        Other Visit Diagnoses    Routine general medical examination at a health care facility    -  Primary   Vaccines up to date. Screening labs checked today. Colonoscopy up to date. Continue diet and exercise. Call with any concerns.    Bilateral hearing loss, unspecified hearing loss type       Refer to ENT today.   Relevant Orders   Ambulatory referral to ENT       LABORATORY TESTING:  Health maintenance labs done last visit.   IMMUNIZATIONS:   - Tdap: Tetanus vaccination status reviewed: last tetanus booster within 10 years. - Influenza: Up to date - Pneumovax: Up to date - Prevnar: Not applicable  SCREENING: - Colonoscopy: Up to date  Discussed with patient purpose of the colonoscopy is to detect colon cancer at curable precancerous or early stages    PATIENT COUNSELING:    Sexuality: Discussed sexually transmitted diseases, partner selection, use of condoms, avoidance of unintended pregnancy  and contraceptive alternatives.   Advised to avoid cigarette smoking.  I discussed with the patient that most people either abstain  from alcohol or drink within safe limits (<=14/week and <=4 drinks/occasion for males, <=7/weeks and <= 3 drinks/occasion for females) and that the risk for alcohol disorders and other health effects rises proportionally with the number of drinks per week and how often a drinker exceeds daily limits.  Discussed cessation/primary prevention of drug use and availability of treatment for abuse.   Diet: Encouraged to adjust caloric intake to maintain  or achieve ideal body weight, to reduce intake of dietary saturated fat and total fat, to limit sodium intake by  avoiding high sodium foods and not adding table salt, and to maintain adequate dietary potassium and calcium preferably from fresh fruits, vegetables, and low-fat dairy products.    stressed the importance of regular exercise  Injury prevention: Discussed safety belts, safety helmets, smoke detector, smoking near bedding or upholstery.   Dental health: Discussed importance of regular tooth brushing, flossing, and dental visits.   Follow up plan: NEXT PREVENTATIVE PHYSICAL DUE IN 1 YEAR. Return 1-2 months follow up smoking.

## 2019-01-12 NOTE — Assessment & Plan Note (Addendum)
Had to stop his chantix. Still smoking. Will start him on wellbutrin. Call with any concerns. Continue to monitor. REcheck 1 month.

## 2019-01-12 NOTE — Assessment & Plan Note (Signed)
Under good control on current regimen. Continue current regimen. Continue to monitor. Call with any concerns. Refills given. Labs drawn today.   

## 2019-01-12 NOTE — Assessment & Plan Note (Addendum)
Under good control on current regimen. Continue current regimen. Continue to monitor. Call with any concerns. Refills given last visit.

## 2019-01-12 NOTE — Assessment & Plan Note (Signed)
Continue to follow with vascular. Call with any concerns. Keep BP and cholesterol under good control.

## 2019-01-12 NOTE — Patient Instructions (Signed)

## 2019-01-12 NOTE — Progress Notes (Signed)
BP 121/83 (BP Location: Left Arm, Patient Position: Sitting, Cuff Size: Normal)   Pulse 70   Temp 97.8 F (36.6 C) (Oral)   Ht 5' 9.5" (1.765 m)   Wt 165 lb 6.4 oz (75 kg)   SpO2 99%   BMI 24.08 kg/m    Subjective:    Patient ID: Daniel Mcneil, male    DOB: July 06, 1960, 58 y.o.   MRN: [id number]  HPI: Daniel Mcneil is a 58 y.o. male  Chief Complaint  Patient presents with  . Annual Exam  . Hypertension    LEG PAIN Duration: chronic Pain: yes Severity: moderate  Quality:  aching Location:  lower legs Bilateral:  yes Onset: gradual Frequency: constant Time of  day:   at random Sudden unintentional leg jerking:   no Paresthesias:   no Decreased sensation:  no Weakness:   no Insomnia:   no Fatigue:   no Treatments attempted: gabapentin  SMOKING CESSATION Smoking Status: current smoker Smoking Amount: 1 pack/day Smoking Onset: years Smoking triggers: stress Type of tobacco use: cigarettes Children in the house: no Other household members who smoke: yes Treatments attempted: chantix Pneumovax: yes, 2016  HYPERTENSION Hypertension status: controlled  Satisfied with current treatment? yes Duration of hypertension: chronic BP monitoring frequency:  not checking BP medication side effects:  no Medication compliance: excellent compliance Previous BP meds: lisinopriil Aspirin: yes Recurrent headaches: no Visual changes: no Palpitations: no Dyspnea: no Chest pain: no Lower extremity edema: no Dizzy/lightheaded: no  Relevant past medical, surgical, family and social history reviewed and updated as indicated. Interim medical history since our last visit reviewed. Allergies and medications reviewed and updated.  Review of Systems  Constitutional: Negative.   HENT: Negative.        Impaired hearing - is progressing and wants to see ENT Doctor  Eyes: Negative.   Respiratory: Negative.   Cardiovascular: Negative.   Gastrointestinal: Negative.    Heartburn well controlled on medication  Endocrine: Negative.   Genitourinary: Negative.   Musculoskeletal: Positive for back pain (progressing - wants to see Orthopedist) and myalgias (knee and leg pain is getting worse). Negative for arthralgias.  Skin: Negative.   Allergic/Immunologic: Positive for environmental allergies.  Neurological: Positive for numbness (and tingling down right leg). Negative for dizziness, tremors, syncope, speech difficulty, weakness, light-headedness and headaches.  Hematological: Negative.   Psychiatric/Behavioral: Negative.     Per HPI unless specifically indicated above     Objective:    BP 121/83 (BP Location: Left Arm, Patient Position: Sitting, Cuff Size: Normal)   Pulse 70   Temp 97.8 F (36.6 C) (Oral)   Ht 5' 9.5" (1.765 m)   Wt 165 lb 6.4 oz (75 kg)   SpO2 99%   BMI 24.08 kg/m   Wt Readings from Last 3 Encounters:  01/12/19 165 lb 6.4 oz (75 kg)  05/12/18 171 lb (77.6 kg)  05/02/18 169 lb (76.7 kg)    Physical Exam Constitutional:      Appearance: Normal appearance.  HENT:     Head: Normocephalic.     Right Ear: Tympanic membrane and ear canal normal.     Left Ear: Tympanic membrane and ear canal normal.     Nose: Nose normal.     Mouth/Throat:     Mouth: Mucous membranes are moist.     Pharynx: Oropharynx is clear.  Eyes:     Pupils: Pupils are equal, round, and reactive to light.  Cardiovascular:     Rate and Rhythm: Normal  rate and regular rhythm.     Heart sounds: Normal heart sounds. No murmur. No gallop.   Pulmonary:     Effort: Pulmonary effort is normal.     Breath sounds: Normal breath sounds.  Abdominal:     General: Abdomen is flat. Bowel sounds are normal.     Palpations: Abdomen is soft.  Musculoskeletal:        General: Normal range of motion.     Cervical back: Normal range of motion.  Skin:    General: Skin is warm.  Neurological:     General: No focal deficit present.     Mental Status: He is alert and  oriented to person, place, and time.     Sensory: Sensory deficit (right lower extremity numb & tingling) present.  Psychiatric:        Mood and Affect: Mood normal.        Behavior: Behavior normal.        Thought Content: Thought content normal.        Judgment: Judgment normal.    Results for orders placed or performed in visit on 12/13/18  Microscopic Examination   URINE  Result Value Ref Range   WBC, UA None seen 0 - 5 /hpf   RBC 0-2 0 - 2 /hpf   Epithelial Cells (non renal) 0-10 0 - 10 /hpf   Bacteria, UA None seen None seen/Few  CBC with Differential OUT  Result Value Ref Range   WBC 9.4 3.4 - 10.8 x10E3/uL   RBC 5.49 4.14 - 5.80 x10E6/uL   Hemoglobin 17.1 13.0 - 17.7 g/dL   Hematocrit 49.8 37.5 - 51.0 %   MCV 91 79 - 97 fL   MCH 31.1 26.6 - 33.0 pg   MCHC 34.3 31.5 - 35.7 g/dL   RDW 13.6 11.6 - 15.4 %   Platelets 233 150 - 450 x10E3/uL   Neutrophils 76 Not Estab. %   Lymphs 14 Not Estab. %   Monocytes 8 Not Estab. %   Eos 2 Not Estab. %   Basos 0 Not Estab. %   Neutrophils Absolute 7.0 1.4 - 7.0 x10E3/uL   Lymphocytes Absolute 1.3 0.7 - 3.1 x10E3/uL   Monocytes Absolute 0.8 0.1 - 0.9 x10E3/uL   EOS (ABSOLUTE) 0.2 0.0 - 0.4 x10E3/uL   Basophils Absolute 0.0 0.0 - 0.2 x10E3/uL   Immature Granulocytes 0 Not Estab. %   Immature Grans (Abs) 0.0 0.0 - 0.1 x10E3/uL  UA/M w/rflx Culture, Routine   Specimen: Urine   URINE  Result Value Ref Range   Specific Gravity, UA 1.025 1.005 - 1.030   pH, UA 5.0 5.0 - 7.5   Color, UA Yellow Yellow   Appearance Ur Clear Clear   Leukocytes,UA Negative Negative   Protein,UA 1+ (A) Negative/Trace   Glucose, UA Negative Negative   Ketones, UA Negative Negative   RBC, UA Trace (A) Negative   Bilirubin, UA Negative Negative   Urobilinogen, Ur 0.2 0.2 - 1.0 mg/dL   Nitrite, UA Negative Negative   Microscopic Examination See below:   PSA  Result Value Ref Range   Prostate Specific Ag, Serum 0.3 0.0 - 4.0 ng/mL  Microalbumin,  Urine Waived  Result Value Ref Range   Microalb, Ur Waived 150 (H) 0 - 19 mg/L   Creatinine, Urine Waived 100 10 - 300 mg/dL   Microalb/Creat Ratio 30-300 (H) <30 mg/g  Lipid Panel w/o Chol/HDL Ratio OUT  Result Value Ref Range   Cholesterol, Total 212 (  H) 100 - 199 mg/dL   Triglycerides 65 0 - 149 mg/dL   HDL 36 (L) >39 mg/dL   VLDL Cholesterol Cal 12 5 - 40 mg/dL   LDL Chol Calc (NIH) 164 (H) 0 - 99 mg/dL  Comp Met (CMET)  Result Value Ref Range   Glucose 119 (H) 65 - 99 mg/dL   BUN 20 6 - 24 mg/dL   Creatinine, Ser 1.04 0.76 - 1.27 mg/dL   GFR calc non Af Amer 80 >59 mL/min/1.73   GFR calc Af Amer 93 >59 mL/min/1.73   BUN/Creatinine Ratio 19 9 - 20   Sodium 142 134 - 144 mmol/L   Potassium 4.0 3.5 - 5.2 mmol/L   Chloride 108 (H) 96 - 106 mmol/L   CO2 19 (L) 20 - 29 mmol/L   Calcium 9.2 8.7 - 10.2 mg/dL   Total Protein 7.2 6.0 - 8.5 g/dL   Albumin 4.4 3.8 - 4.9 g/dL   Globulin, Total 2.8 1.5 - 4.5 g/dL   Albumin/Globulin Ratio 1.6 1.2 - 2.2   Bilirubin Total <0.2 0.0 - 1.2 mg/dL   Alkaline Phosphatase 90 39 - 117 IU/L   AST 21 0 - 40 IU/L   ALT 17 0 - 44 IU/L      Assessment & Plan:   Problem List Items Addressed This Visit      Cardiovascular and Mediastinum   Abdominal aortic aneurysm (AAA) without rupture (HCC)    Continue to follow with vascular. Call with any concerns. Keep BP and cholesterol under good control.         Digestive   GERD (gastroesophageal reflux disease)    Under good control on current regimen. Continue current regimen. Continue to monitor. Call with any concerns. Refills given last visit.          Genitourinary   Benign hypertensive renal disease    Under good control on current regimen. Continue current regimen. Continue to monitor. Call with any concerns. Refills given. Labs drawn today       Relevant Orders   Basic Metabolic Panel (BMET)     Other   Tobacco use disorder    Had to stop his chantix. Still smoking.          Other Visit Diagnoses    Routine general medical examination at a health care facility    -  Primary   Vaccines up to date. Screening labs checked today. Colonoscopy up to date. Continue diet and exercise. Call with any concerns.    Bilateral hearing loss, unspecified hearing loss type       Refer to ENT today.   Relevant Orders   Ambulatory referral to ENT   Lumbar radiculopathy       Pain is getting severe and patient agreeable to see Ortho. Will send referral.   Relevant Medications   buPROPion (WELLBUTRIN SR) 150 MG 12 hr tablet   Other Relevant Orders   Ambulatory referral to Orthopedic Surgery       Follow up plan: Return 1-2 months follow up smoking.

## 2019-01-13 LAB — BASIC METABOLIC PANEL
BUN/Creatinine Ratio: 24 — ABNORMAL HIGH (ref 9–20)
BUN: 22 mg/dL (ref 6–24)
CO2: 22 mmol/L (ref 20–29)
Calcium: 9.4 mg/dL (ref 8.7–10.2)
Chloride: 109 mmol/L — ABNORMAL HIGH (ref 96–106)
Creatinine, Ser: 0.91 mg/dL (ref 0.76–1.27)
GFR calc Af Amer: 109 mL/min/{1.73_m2} (ref >59)
GFR calc non Af Amer: 94 mL/min/{1.73_m2} (ref >59)
Glucose: 72 mg/dL (ref 65–99)
Potassium: 4.4 mmol/L (ref 3.5–5.2)
Sodium: 143 mmol/L (ref 134–144)

## 2019-01-15 ENCOUNTER — Encounter: Payer: Self-pay | Admitting: Family Medicine

## 2019-02-10 ENCOUNTER — Telehealth: Payer: Self-pay | Admitting: Family Medicine

## 2019-02-10 ENCOUNTER — Encounter: Payer: Self-pay | Admitting: Family Medicine

## 2019-02-10 NOTE — Telephone Encounter (Signed)
Called pt to r/s 03/20/19 appt, no answer, left vm, sending letter.

## 2019-03-09 ENCOUNTER — Telehealth: Payer: Self-pay

## 2019-03-09 NOTE — Telephone Encounter (Signed)
Prior Authorization initiated via CoverMyMeds for Dexilant  Key: [id number]  [place] Approved

## 2019-03-20 ENCOUNTER — Ambulatory Visit: Payer: BC Managed Care – PPO | Admitting: Family Medicine

## 2019-03-22 ENCOUNTER — Encounter: Payer: Self-pay | Admitting: Family Medicine

## 2019-03-22 ENCOUNTER — Telehealth (INDEPENDENT_AMBULATORY_CARE_PROVIDER_SITE_OTHER): Payer: BC Managed Care – PPO | Admitting: Family Medicine

## 2019-03-22 ENCOUNTER — Ambulatory Visit: Payer: BC Managed Care – PPO | Admitting: Family Medicine

## 2019-03-22 VITALS — BP 139/85 | Temp 98.2°F

## 2019-03-22 DIAGNOSIS — F17200 Nicotine dependence, unspecified, uncomplicated: Secondary | ICD-10-CM

## 2019-03-22 MED ORDER — LISINOPRIL 20 MG PO TABS: ORAL_TABLET | ORAL | 0 refills | Status: DC

## 2019-03-22 MED ORDER — BUPROPION HCL ER (SR) 150 MG PO TB12: 150.0000 mg | ORAL_TABLET | Freq: Two times a day (BID) | ORAL | 1 refills | Status: DC

## 2019-03-22 MED ORDER — NAPROXEN 500 MG PO TABS: 500.0000 mg | ORAL_TABLET | Freq: Two times a day (BID) | ORAL | 0 refills | Status: DC

## 2019-03-22 NOTE — Progress Notes (Signed)
BP 139/85   Temp 98.2 F (36.8 C)    Subjective:    Patient ID: Daniel Mcneil, male    DOB: 08-25-1960, 59 y.o.   MRN: [id number]  HPI: Daniel Mcneil is a 59 y.o. male  Chief Complaint  Patient presents with  . Nicotine Dependence    Has reduced smoking by three packs a week   SMOKING CESSATION Smoking Status: current every day smoker Smoking Amount: less than a pack a day Smoking Quit Date: not set Smoking triggers: stress, boredom Type of tobacco use:  cigarettes Children in the house: no Other household members who smoke: no  Pneumovax: yes  Relevant past medical, surgical, family and social history reviewed and updated as indicated. Interim medical history since our last visit reviewed. Allergies and medications reviewed and updated.  Review of Systems  Constitutional: Negative.   Respiratory: Negative.   Cardiovascular: Negative.   Musculoskeletal: Negative.   Psychiatric/Behavioral: Negative.     Per HPI unless specifically indicated above     Objective:    BP 139/85   Temp 98.2 F (36.8 C)   Wt Readings from Last 3 Encounters:  01/12/19 165 lb 6.4 oz (75 kg)  05/12/18 171 lb (77.6 kg)  05/02/18 169 lb (76.7 kg)    Physical Exam Vitals and nursing note reviewed.  Constitutional:      General: He is not in acute distress.    Appearance: Normal appearance. He is not ill-appearing, toxic-appearing or diaphoretic.  HENT:     Head: Normocephalic and atraumatic.     Right Ear: External ear normal.     Left Ear: External ear normal.     Nose: Nose normal.     Mouth/Throat:     Mouth: Mucous membranes are moist.     Pharynx: Oropharynx is clear.  Eyes:     General: No scleral icterus.       Right eye: No discharge.        Left eye: No discharge.     Conjunctiva/sclera: Conjunctivae normal.     Pupils: Pupils are equal, round, and reactive to light.  Pulmonary:     Effort: Pulmonary effort is normal. No respiratory distress.     Comments: Speaking  in full sentences Musculoskeletal:        General: Normal range of motion.     Cervical back: Normal range of motion.  Skin:    Coloration: Skin is not jaundiced or pale.     Findings: No bruising, erythema, lesion or rash.  Neurological:     Mental Status: He is alert and oriented to person, place, and time. Mental status is at baseline.  Psychiatric:        Mood and Affect: Mood normal.        Behavior: Behavior normal.        Thought Content: Thought content normal.        Judgment: Judgment normal.     Results for orders placed or performed in visit on [id number]  Basic Metabolic Panel (BMET)  Result Value Ref Range   Glucose 72 65 - 99 mg/dL   BUN 22 6 - 24 mg/dL   Creatinine, Ser 0.91 0.76 - 1.27 mg/dL   GFR calc non Af Amer 94 >59 mL/min/1.73   GFR calc Af Amer 109 >59 mL/min/1.73   BUN/Creatinine Ratio 24 (H) 9 - 20   Sodium 143 134 - 144 mmol/L   Potassium 4.4 3.5 - 5.2 mmol/L   Chloride 109 (H) 96 -  106 mmol/L   CO2 22 20 - 29 mmol/L   Calcium 9.4 8.7 - 10.2 mg/dL      Assessment & Plan:   Problem List Items Addressed This Visit      Other   Tobacco use disorder - Primary    Doing very well on the wellbutrin, has cut down significantly. Continue wellbutrin. Continue to monitor. Call with any concerns.           Follow up plan: Return June, for follow up.   . This visit was completed via MyChart due to the restrictions of the COVID-19 pandemic. All issues as above were discussed and addressed. Physical exam was done as above through visual confirmation on MyChart. If it was felt that the patient should be evaluated in the office, they were directed there. The patient verbally consented to this visit. . Location of the patient: home . Location of the provider: home . Those involved with this call:  . Provider: Park Liter, DO . CMA: Tiffany Reel, CMA . Front Desk/Registration: Don Perking  . Time spent on call: 15 minutes with patient face to  face via video conference. More than 50% of this time was spent in counseling and coordination of care. 23 minutes total spent in review of patient's record and preparation of their chart.

## 2019-03-22 NOTE — Progress Notes (Signed)
Lvm to make 6 month f/u sent letter

## 2019-03-22 NOTE — Assessment & Plan Note (Signed)
Doing very well on the wellbutrin, has cut down significantly. Continue wellbutrin. Continue to monitor. Call with any concerns.

## 2019-04-15 ENCOUNTER — Ambulatory Visit: Payer: BC Managed Care – PPO | Attending: Internal Medicine

## 2019-04-15 DIAGNOSIS — Z23 Encounter for immunization: Secondary | ICD-10-CM

## 2019-04-15 NOTE — Progress Notes (Signed)
   Covid-19 Vaccination Clinic  Name:  Daniel Mcneil    MRN: [id number] DOB: 1960-10-19  04/15/2019  [name] was observed post Covid-19 immunization for 15 minutes without incident. He was provided with Vaccine Information Sheet and instruction to access the V-Safe system.   [name] was instructed to call 911 with any severe reactions post vaccine: Marland Kitchen Difficulty breathing  . Swelling of face and throat  . A fast heartbeat  . A bad rash all over body  . Dizziness and weakness   Immunizations Administered    Name Date Dose VIS Date Route   Pfizer COVID-19 Vaccine 04/15/2019  1:26 PM 0.3 mL 01/06/2019 Intramuscular   Manufacturer: [place]   Lot: [id number]   [place]: [id number]

## 2019-05-09 ENCOUNTER — Ambulatory Visit: Payer: BC Managed Care – PPO | Attending: Internal Medicine

## 2019-05-09 DIAGNOSIS — Z23 Encounter for immunization: Secondary | ICD-10-CM

## 2019-05-09 NOTE — Progress Notes (Signed)
   Covid-19 Vaccination Clinic  Name:  Daniel Mcneil    MRN: [id number] DOB: May 14, 1960  05/09/2019  [name] was observed post Covid-19 immunization for 15 minutes without incident. He was provided with Vaccine Information Sheet and instruction to access the V-Safe system.   [name] was instructed to call 911 with any severe reactions post vaccine: Marland Kitchen Difficulty breathing  . Swelling of face and throat  . A fast heartbeat  . A bad rash all over body  . Dizziness and weakness   Immunizations Administered    Name Date Dose VIS Date Route   Pfizer COVID-19 Vaccine 05/09/2019  2:09 PM 0.3 mL 01/06/2019 Intramuscular   Manufacturer: [place]   Lot: [id number]   [place]: [id number]

## 2019-05-15 ENCOUNTER — Ambulatory Visit (INDEPENDENT_AMBULATORY_CARE_PROVIDER_SITE_OTHER): Payer: BC Managed Care – PPO

## 2019-05-15 ENCOUNTER — Ambulatory Visit (INDEPENDENT_AMBULATORY_CARE_PROVIDER_SITE_OTHER): Payer: BC Managed Care – PPO | Admitting: Nurse Practitioner

## 2019-05-15 ENCOUNTER — Other Ambulatory Visit: Payer: Self-pay

## 2019-05-15 ENCOUNTER — Encounter (INDEPENDENT_AMBULATORY_CARE_PROVIDER_SITE_OTHER): Payer: Self-pay | Admitting: Nurse Practitioner

## 2019-05-15 VITALS — BP 129/87 | HR 67 | Ht 69.0 in | Wt 164.0 lb

## 2019-05-15 DIAGNOSIS — I714 Abdominal aortic aneurysm, without rupture, unspecified: Secondary | ICD-10-CM

## 2019-05-15 DIAGNOSIS — M4726 Other spondylosis with radiculopathy, lumbar region: Secondary | ICD-10-CM | POA: Diagnosis not present

## 2019-05-15 DIAGNOSIS — F17200 Nicotine dependence, unspecified, uncomplicated: Secondary | ICD-10-CM

## 2019-05-16 ENCOUNTER — Encounter (INDEPENDENT_AMBULATORY_CARE_PROVIDER_SITE_OTHER): Payer: Self-pay | Admitting: Nurse Practitioner

## 2019-05-16 NOTE — Progress Notes (Signed)
Subjective:    Patient ID: Daniel Mcneil, male    DOB: 1960/11/11, 59 y.o.   MRN: [id number] Chief Complaint  Patient presents with  . Follow-up    U/S Follow up    The patient returns to the office for surveillance of a known abdominal aortic aneurysm. Patient denies abdominal pain or back pain, no other abdominal complaints. No changes suggesting embolic episodes.   There have been no interval changes in the patient's overall health care since his last visit.  Patient denies amaurosis fugax or TIA symptoms. There is no history of claudication or rest pain symptoms of the lower extremities. The patient denies angina or shortness of breath.   Duplex US of the aorta and iliac arteries shows an AAA measured 3.2 cm with no iliac artery aneurysms.  The largest iliac artery measures 1.8 cm on the right common iliac.  Previous measurement of the aorta was 3.2 cm on 05/12/2018.   Review of Systems  Musculoskeletal: Positive for back pain.  All other systems reviewed and are negative.      Objective:   Physical Exam Vitals reviewed.  HENT:     Head: Normocephalic.  Cardiovascular:     Rate and Rhythm: Normal rate.     Pulses: Normal pulses.  Pulmonary:     Effort: Pulmonary effort is normal.     Breath sounds: Normal breath sounds.  Musculoskeletal:        General: Normal range of motion.  Skin:    General: Skin is warm.     Capillary Refill: Capillary refill takes less than 2 seconds.  Neurological:     Mental Status: He is oriented to person, place, and time.  Psychiatric:        Mood and Affect: Mood normal.        Behavior: Behavior normal.        Thought Content: Thought content normal.        Judgment: Judgment normal.     BP 129/87   Pulse 67   Ht 5\' 9"  (1.753 m)   Wt 164 lb (74.4 kg)   BMI 24.22 kg/m   Past Medical History:  Diagnosis Date  . Chronic kidney disease, stage I   . Dysphagia   . GERD (gastroesophageal reflux disease)   . Herniated disc,  cervical   . Hypertension   . Hypertensive nephropathy   . Lumbar herniated disc   . Tobacco use disorder   . Urinary hesitancy     Social History   Socioeconomic History  . Marital status: Married    Spouse name: Not on file  . Number of children: Not on file  . Years of education: Not on file  . Highest education level: Not on file  Occupational History  . Not on file  Tobacco Use  . Smoking status: Current Every Day Smoker    Packs/day: 1.00    Years: 36.00    Pack years: 36.00    Types: Cigarettes  . Smokeless tobacco: Never Used  . Tobacco comment: since age 65.  Substance and Sexual Activity  . Alcohol use: Yes    Alcohol/week: 15.0 standard drinks    Types: 15 Cans of beer per week    Comment:    . Drug use: No  . Sexual activity: Yes    Birth control/protection: None  Other Topics Concern  . Not on file  Social History Narrative  . Not on file   Social Determinants of Health  Financial Resource Strain:   . Difficulty of Paying Living Expenses:   Food Insecurity:   . Worried About Charity fundraiser in the Last Year:   . Arboriculturist in the Last Year:   Transportation Needs:   . Film/video editor (Medical):   Marland Kitchen Lack of Transportation (Non-Medical):   Physical Activity:   . Days of Exercise per Week:   . Minutes of Exercise per Session:   Stress:   . Feeling of Stress :   Social Connections:   . Frequency of Communication with Friends and Family:   . Frequency of Social Gatherings with Friends and Family:   . Attends Religious Services:   . Active Member of Clubs or Organizations:   . Attends Archivist Meetings:   Marland Kitchen Marital Status:   Intimate Partner Violence:   . Fear of Current or Ex-Partner:   . Emotionally Abused:   Marland Kitchen Physically Abused:   . Sexually Abused:     Past Surgical History:  Procedure Laterality Date  . BIOPSY [name] 04/10/2016   Procedure: BIOPSY;  Surgeon: [name], MD;  Location: [facility];   Service: Endoscopy;  Laterality: [name];  . COLONOSCOPY WITH PROPOFOL [name] 04/10/2016   Procedure: COLONOSCOPY WITH PROPOFOL;  Surgeon: [name], MD;  Location: [facility];  Service: Endoscopy;  Laterality: [name];  requests - not first  . HERNIA REPAIR      Family History  Problem Relation Age of Onset  . Stroke Mother   . Hypertension Mother   . Cancer Father   . Hypertension Father   . Diabetes Father   . Heart disease Father   . Cancer Maternal Grandfather   . Hypertension Sister   . Cancer Sister        Lung cancer    Allergies  Allergen Reactions  . Flomax [Tamsulosin]     dizziness       Assessment & Plan:   1. Abdominal aortic aneurysm (AAA) without rupture ([place]) No surgery or intervention at this time. The patient has an asymptomatic abdominal aortic aneurysm that is less than 4 cm in maximal diameter.  I have discussed the natural history of abdominal aortic aneurysm and the small risk of rupture for aneurysm less than 5 cm in size.  However, as these small aneurysms tend to enlarge over time, continued surveillance with ultrasound or CT scan is mandatory.  I have also discussed optimizing medical management with hypertension and lipid control and the importance of abstinence from tobacco.  The patient is also encouraged to exercise a minimum of 30 minutes 4 times a week.  Should the patient develop new onset abdominal or back pain or signs of peripheral embolization they are instructed to seek medical attention immediately and to alert the physician providing care that they have an aneurysm.  The patient voices their understanding. The patient will return in 12 months with an aortic duplex.   2. Tobacco use disorder Patient actively working on smoking cessation.  He has been utilizing Wellbutrin to try to help with cravings.  Discussed that tobacco use would have on his abdominal aneurysm.  Patient will continue with smoking cessation.  3. Osteoarthritis of  spine with radiculopathy, lumbar region Continue NSAID medications as already ordered, these medications have been reviewed and there are no changes at this time.  Continued activity and therapy was stressed.    Current Outpatient Medications on File Prior to Visit  Medication Sig Dispense Refill  .  Aspirin-Salicylamide-Caffeine (BC HEADACHE POWDER PO) Take by mouth as needed.    Marland Kitchen buPROPion (WELLBUTRIN SR) 150 MG 12 hr tablet Take 1 tablet (150 mg total) by mouth 2 (two) times daily. 180 tablet 1  . dexlansoprazole (DEXILANT) 60 MG capsule Take 1 capsule (60 mg total) by mouth daily. 90 capsule 3  . lisinopril (ZESTRIL) 20 MG tablet TAKE 1 TABLET BY MOUTH ONCE DAILY 90 tablet 0  . Multiple Vitamins-Minerals (EQ MULTIVITAMINS ADULT GUMMY PO) Take by mouth daily.    . naproxen (NAPROSYN) 500 MG tablet Take 1 tablet (500 mg total) by mouth 2 (two) times daily with a meal. 180 tablet 0   No current facility-administered medications on file prior to visit.    There are no Patient Instructions on file for this visit. No follow-ups on file.   Kris Hartmann, NP

## 2019-06-06 ENCOUNTER — Other Ambulatory Visit: Payer: Self-pay | Admitting: Family Medicine

## 2019-06-06 NOTE — Telephone Encounter (Signed)
Requested Prescriptions  Pending Prescriptions Disp Refills  . lisinopril (ZESTRIL) 20 MG tablet [Pharmacy Med Name: Lisinopril 20 MG Oral Tablet] 90 tablet 0    Sig: Take 1 tablet by mouth once daily     Cardiovascular:  ACE Inhibitors Passed - 06/06/2019  1:05 PM      Passed - Cr in normal range and within 180 days    Creatinine, Ser  Date Value Ref Range Status  01/12/2019 0.91 0.76 - 1.27 mg/dL Final         Passed - K in normal range and within 180 days    Potassium  Date Value Ref Range Status  01/12/2019 4.4 3.5 - 5.2 mmol/L Final         Passed - Patient is not pregnant      Passed - Last BP in normal range    BP Readings from Last 1 Encounters:  05/15/19 129/87         Passed - Valid encounter within last 6 months    Recent Outpatient Visits          2 months ago Tobacco use disorder   [place], Megan P, DO   4 months ago Routine general medical examination at a health care facility   [facility] [place] P, DO   5 months ago Benign hypertensive renal disease   [facility] [place], Megan P, DO   1 year ago Osteoarthritis of spine with radiculopathy, lumbar region   [facility], [place], DO   1 year ago Acute right-sided low back pain with right-sided sciatica   [facility] [place], [place]      Future Appointments            In 3 months Wynetta Emery, Barb Merino, DO [employer], [place]

## 2019-09-06 ENCOUNTER — Other Ambulatory Visit: Payer: Self-pay | Admitting: Family Medicine

## 2019-09-06 NOTE — Telephone Encounter (Signed)
Approved per protocol.  Requested Prescriptions  Pending Prescriptions Disp Refills  . lisinopril (ZESTRIL) 20 MG tablet [Pharmacy Med Name: Lisinopril 20 MG Oral Tablet] 90 tablet 0    Sig: Take 1 tablet by mouth once daily     Cardiovascular:  ACE Inhibitors Failed - 09/06/2019  1:53 PM      Failed - Cr in normal range and within 180 days    Creatinine, Ser  Date Value Ref Range Status  01/12/2019 0.91 0.76 - 1.27 mg/dL Final         Failed - K in normal range and within 180 days    Potassium  Date Value Ref Range Status  01/12/2019 4.4 3.5 - 5.2 mmol/L Final         Passed - Patient is not pregnant      Passed - Last BP in normal range    BP Readings from Last 1 Encounters:  05/15/19 129/87         Passed - Valid encounter within last 6 months    Recent Outpatient Visits          5 months ago Tobacco use disorder   [place], Megan P, DO   7 months ago Routine general medical examination at a health care facility   [facility] [place], DO   9 months ago Benign hypertensive renal disease   [facility] [place], Megan P, DO   1 year ago Osteoarthritis of spine with radiculopathy, lumbar region   [facility], [place], DO   1 year ago Acute right-sided low back pain with right-sided sciatica   [facility] [place], [place]      Future Appointments            In 1 week Wynetta Emery, Barb Merino, DO [employer], [place]

## 2019-09-18 ENCOUNTER — Encounter: Payer: Self-pay | Admitting: Family Medicine

## 2019-09-18 ENCOUNTER — Ambulatory Visit (INDEPENDENT_AMBULATORY_CARE_PROVIDER_SITE_OTHER): Payer: BC Managed Care – PPO | Admitting: Family Medicine

## 2019-09-18 ENCOUNTER — Other Ambulatory Visit: Payer: Self-pay

## 2019-09-18 VITALS — BP 129/86 | HR 70 | Temp 97.7°F | Wt 159.4 lb

## 2019-09-18 DIAGNOSIS — I129 Hypertensive chronic kidney disease with stage 1 through stage 4 chronic kidney disease, or unspecified chronic kidney disease: Secondary | ICD-10-CM

## 2019-09-18 DIAGNOSIS — R202 Paresthesia of skin: Secondary | ICD-10-CM

## 2019-09-18 DIAGNOSIS — K219 Gastro-esophageal reflux disease without esophagitis: Secondary | ICD-10-CM

## 2019-09-18 DIAGNOSIS — N181 Chronic kidney disease, stage 1: Secondary | ICD-10-CM

## 2019-09-18 DIAGNOSIS — M5416 Radiculopathy, lumbar region: Secondary | ICD-10-CM | POA: Diagnosis not present

## 2019-09-18 DIAGNOSIS — F17200 Nicotine dependence, unspecified, uncomplicated: Secondary | ICD-10-CM

## 2019-09-18 MED ORDER — BUPROPION HCL ER (SR) 150 MG PO TB12
ORAL_TABLET | ORAL | 1 refills | Status: DC
Start: 2019-09-18 — End: 2019-11-20

## 2019-09-18 MED ORDER — NICOTINE 21 MG/24HR TD PT24: 21.0000 mg | MEDICATED_PATCH | Freq: Every day | TRANSDERMAL | 3 refills | Status: DC

## 2019-09-18 MED ORDER — NAPROXEN 500 MG PO TABS
500.0000 mg | ORAL_TABLET | Freq: Two times a day (BID) | ORAL | 0 refills | Status: DC
Start: 2019-09-18 — End: 2019-11-20

## 2019-09-18 MED ORDER — LISINOPRIL 20 MG PO TABS: 20.0000 mg | ORAL_TABLET | Freq: Every day | ORAL | 0 refills | Status: DC

## 2019-09-18 NOTE — Assessment & Plan Note (Signed)
Under good control on current regimen. Continue current regimen. Continue to monitor. Call with any concerns. Refills given.   

## 2019-09-18 NOTE — Assessment & Plan Note (Signed)
Labs drawn today. Await results.  

## 2019-09-18 NOTE — Assessment & Plan Note (Signed)
Would like to get back into see ortho to discuss options. Not particularly interested in surgery. Referral generated today.

## 2019-09-18 NOTE — Assessment & Plan Note (Signed)
Will restart wellbutrin and nicoderm patches. Discussed strategies to quitting. Call with any concerns.

## 2019-09-18 NOTE — Assessment & Plan Note (Signed)
Under good control on current regimen. Continue current regimen. Continue to monitor. Call with any concerns. Refills given. Labs drawn today.   

## 2019-09-18 NOTE — Progress Notes (Signed)
BP 129/86 (BP Location: Left Arm, Patient Position: Sitting, Cuff Size: Normal)   Pulse 70   Temp 97.7 F (36.5 C) (Oral)   Wt 159 lb 6.4 oz (72.3 kg)   SpO2 98%   BMI 23.54 kg/m    Subjective:    Patient ID: Daniel Mcneil, male    DOB: April 07, 1960, 59 y.o.   MRN: [id number]  HPI: Daniel Mcneil is a 59 y.o. male  Chief Complaint  Patient presents with  . Nicotine Dependence  . Hypertension   HYPERTENSION Hypertension status: controlled  Satisfied with current treatment? yes Duration of hypertension: chronic BP monitoring frequency:  not checking BP medication side effects:  no Medication compliance: excellent compliance Previous BP meds: lisinopril Aspirin: no Recurrent headaches: no Visual changes: no Palpitations: no Dyspnea: no Chest pain: no Lower extremity edema: no Dizzy/lightheaded: no  GERD GERD control status: controlled  Satisfied with current treatment? yes Heartburn frequency: not if he takes his medicine Medication side effects: no  Medication compliance: stable Dysphagia: no Odynophagia:  no Hematemesis: no Blood in stool: no EGD: no  SMOKING CESSATION Smoking Status: daily smoker Smoking Amount: carton a week Smoking Quit Date: not set Smoking triggers: stress Type of tobacco use: cigarettes Children in the house: no Other household members who smoke: no Treatments attempted: chantix, wellbutrin Pneumovax: UTD  Back is acting up again. He wants to get back in to consider injections. Still acting up.    About a month ago, he was having numbness and tingling in her R hand in his 1st finger and thumb for about a month. It made him not be able to grasp and he had difficulty with pinching. It seems to have gotten better, but he wasn't sure what was going on and it made him unable to use a hammer. No other concerns or complaints at this time.   Relevant past medical, surgical, family and social history reviewed and updated as indicated. Interim  medical history since our last visit reviewed. Allergies and medications reviewed and updated.  Review of Systems  Constitutional: Negative.   Respiratory: Negative.   Cardiovascular: Negative.   Gastrointestinal: Negative.   Musculoskeletal: Positive for arthralgias. Negative for back pain, gait problem, joint swelling, myalgias, neck pain and neck stiffness.  Skin: Negative.   Psychiatric/Behavioral: Negative.     Per HPI unless specifically indicated above     Objective:    BP 129/86 (BP Location: Left Arm, Patient Position: Sitting, Cuff Size: Normal)   Pulse 70   Temp 97.7 F (36.5 C) (Oral)   Wt 159 lb 6.4 oz (72.3 kg)   SpO2 98%   BMI 23.54 kg/m   Wt Readings from Last 3 Encounters:  09/18/19 159 lb 6.4 oz (72.3 kg)  05/15/19 164 lb (74.4 kg)  01/12/19 165 lb 6.4 oz (75 kg)    Physical Exam Vitals and nursing note reviewed.  Constitutional:      General: He is not in acute distress.    Appearance: Normal appearance. He is not ill-appearing, toxic-appearing or diaphoretic.  HENT:     Head: Normocephalic and atraumatic.     Right Ear: External ear normal.     Left Ear: External ear normal.     Nose: Nose normal.     Mouth/Throat:     Mouth: Mucous membranes are moist.     Pharynx: Oropharynx is clear.  Eyes:     General: No scleral icterus.       Right eye: No discharge.  Left eye: No discharge.     Extraocular Movements: Extraocular movements intact.     Conjunctiva/sclera: Conjunctivae normal.     Pupils: Pupils are equal, round, and reactive to light.  Cardiovascular:     Rate and Rhythm: Normal rate and regular rhythm.     Pulses: Normal pulses.     Heart sounds: Normal heart sounds. No murmur heard.  No friction rub. No gallop.   Pulmonary:     Effort: Pulmonary effort is normal. No respiratory distress.     Breath sounds: Normal breath sounds. No stridor. No wheezing, rhonchi or rales.  Chest:     Chest wall: No tenderness.    Musculoskeletal:        General: Normal range of motion.     Cervical back: Normal range of motion and neck supple.  Skin:    General: Skin is warm and dry.     Capillary Refill: Capillary refill takes less than 2 seconds.     Coloration: Skin is not jaundiced or pale.     Findings: No bruising, erythema, lesion or rash.  Neurological:     General: No focal deficit present.     Mental Status: He is alert and oriented to person, place, and time. Mental status is at baseline.  Psychiatric:        Mood and Affect: Mood normal.        Behavior: Behavior normal.        Thought Content: Thought content normal.        Judgment: Judgment normal.     Results for orders placed or performed in visit on 50/03/70  Basic Metabolic Panel (BMET)  Result Value Ref Range   Glucose 72 65 - 99 mg/dL   BUN 22 6 - 24 mg/dL   Creatinine, Ser 0.91 0.76 - 1.27 mg/dL   GFR calc non Af Amer 94 >59 mL/min/1.73   GFR calc Af Amer 109 >59 mL/min/1.73   BUN/Creatinine Ratio 24 (H) 9 - 20   Sodium 143 134 - 144 mmol/L   Potassium 4.4 3.5 - 5.2 mmol/L   Chloride 109 (H) 96 - 106 mmol/L   CO2 22 20 - 29 mmol/L   Calcium 9.4 8.7 - 10.2 mg/dL      Assessment & Plan:   Problem List Items Addressed This Visit      Digestive   GERD (gastroesophageal reflux disease)    Under good control on current regimen. Continue current regimen. Continue to monitor. Call with any concerns. Refills given.          Nervous and Auditory   Lumbar radiculopathy    Would like to get back into see ortho to discuss options. Not particularly interested in surgery. Referral generated today.      Relevant Medications   nicotine (NICODERM CQ) 21 mg/24hr patch   buPROPion (WELLBUTRIN SR) 150 MG 12 hr tablet   Other Relevant Orders   Ambulatory referral to Orthopedic Surgery     Genitourinary   Benign hypertensive renal disease - Primary    Under good control on current regimen. Continue current regimen. Continue to  monitor. Call with any concerns. Refills given. Labs drawn today.       Relevant Orders   Basic metabolic panel   Chronic kidney disease, stage I    Labs drawn today. Await results.         Other   Tobacco use disorder    Will restart wellbutrin and nicoderm patches. Discussed strategies to  quitting. Call with any concerns.        Other Visit Diagnoses    Paresthesias       Will check x-rays. Await results. Treat as needed.    Relevant Orders   DG Cervical Spine Complete   DG Shoulder Right   DG Wrist Complete Right       Follow up plan: Return in about 2 months (around 11/18/2019).

## 2019-09-19 ENCOUNTER — Encounter: Payer: Self-pay | Admitting: Family Medicine

## 2019-09-19 LAB — BASIC METABOLIC PANEL
BUN/Creatinine Ratio: 19 (ref 9–20)
BUN: 20 mg/dL (ref 6–24)
CO2: 23 mmol/L (ref 20–29)
Calcium: 9.3 mg/dL (ref 8.7–10.2)
Chloride: 105 mmol/L (ref 96–106)
Creatinine, Ser: 1.03 mg/dL (ref 0.76–1.27)
GFR calc Af Amer: 93 mL/min/{1.73_m2} (ref >59)
GFR calc non Af Amer: 81 mL/min/{1.73_m2} (ref >59)
Glucose: 120 mg/dL — ABNORMAL HIGH (ref 65–99)
Potassium: 4.4 mmol/L (ref 3.5–5.2)
Sodium: 140 mmol/L (ref 134–144)

## 2019-09-22 ENCOUNTER — Ambulatory Visit
Admission: RE | Admit: 2019-09-22 | Discharge: 2019-09-22 | Disposition: A | Discharge status: Home / Self Care | Payer: BC Managed Care – PPO | Source: Ambulatory Visit | Attending: Family Medicine | Admitting: Family Medicine

## 2019-09-22 ENCOUNTER — Ambulatory Visit
Admission: RE | Admit: 2019-09-22 | Discharge: 2019-09-22 | Disposition: A | Discharge status: Home / Self Care | Payer: BC Managed Care – PPO | Attending: Family Medicine | Admitting: Family Medicine

## 2019-09-22 ENCOUNTER — Other Ambulatory Visit: Payer: Self-pay

## 2019-09-22 DIAGNOSIS — R202 Paresthesia of skin: Secondary | ICD-10-CM | POA: Diagnosis not present

## 2019-11-20 ENCOUNTER — Encounter: Payer: Self-pay | Admitting: Family Medicine

## 2019-11-20 ENCOUNTER — Other Ambulatory Visit: Payer: Self-pay

## 2019-11-20 ENCOUNTER — Ambulatory Visit (INDEPENDENT_AMBULATORY_CARE_PROVIDER_SITE_OTHER): Payer: BC Managed Care – PPO | Admitting: Family Medicine

## 2019-11-20 VITALS — BP 142/92 | HR 63 | Temp 98.3°F | Ht 69.0 in | Wt 162.0 lb

## 2019-11-20 DIAGNOSIS — Z23 Encounter for immunization: Secondary | ICD-10-CM | POA: Diagnosis not present

## 2019-11-20 DIAGNOSIS — F17200 Nicotine dependence, unspecified, uncomplicated: Secondary | ICD-10-CM

## 2019-11-20 DIAGNOSIS — M5416 Radiculopathy, lumbar region: Secondary | ICD-10-CM

## 2019-11-20 DIAGNOSIS — R0689 Other abnormalities of breathing: Secondary | ICD-10-CM

## 2019-11-20 MED ORDER — LISINOPRIL 20 MG PO TABS
20.0000 mg | ORAL_TABLET | Freq: Every day | ORAL | 0 refills | Status: DC
Start: 2019-11-20 — End: 2020-01-23

## 2019-11-20 MED ORDER — NAPROXEN 500 MG PO TABS
500.0000 mg | ORAL_TABLET | Freq: Two times a day (BID) | ORAL | 0 refills | Status: DC
Start: 2019-11-20 — End: 2020-01-23

## 2019-11-20 MED ORDER — DEXILANT 60 MG PO CPDR
60.0000 mg | DELAYED_RELEASE_CAPSULE | Freq: Every day | ORAL | 0 refills | Status: DC
Start: 2019-11-20 — End: 2020-01-23

## 2019-11-20 NOTE — Progress Notes (Signed)
BP (!) 142/92 (BP Location: Left Arm, Patient Position: Sitting)   Pulse 63   Temp 98.3 F (36.8 C) (Oral)   Ht 5\' 9"  (1.753 m)   Wt 162 lb (73.5 kg)   SpO2 98%   BMI 23.92 kg/m    Subjective:    Patient ID: Daniel Mcneil, male    DOB: [birth year], 59 y.o.   MRN: [id number]  HPI: Verlon Pischke is a 59 y.o. male  Chief Complaint  Patient presents with  . Gastroesophageal Reflux    follow up   . Flu Vaccine  . tobacco use    has not used patch  . Hypertension   SMOKING CESSATION Smoking Status: current every day smoker Smoking Amount: carton a week Smoking Quit Date: not set Smoking triggers: relaxing Type of tobacco use: cigarettes Children in the house: no Other household members who smoke: yes Treatments attempted: chantix, wellbutrin, patches Pneumovax: up to date  Back has been acting up. Going through PT right now. He feels like PT helps a bit, but only temporarily. He is working with ortho. He notes that it doesn't last. He has 1 more PT appointment and then he's seeing the orthopedist again.   ???SLEEP APNEA Sleep apnea status: unknown Duration: past couple of months Last sleep study: never Treatments attempted: none  Wakes feeling refreshed:  sometimes Daytime hypersomnolence:  no Fatigue:  no Insomnia:  no Good sleep hygiene:  yes Difficulty falling asleep:  no Difficulty staying asleep:  no Snoring bothers bed partner:  no Observed apnea by bed partner: yes Obesity:  no Hypertension: yes  Pulmonary hypertension:  no Coronary artery disease:  no  Relevant past medical, surgical, family and social history reviewed and updated as indicated. Interim medical history since our last visit reviewed. Allergies and medications reviewed and updated.  Review of Systems  Constitutional: Negative.   Respiratory: Negative.   Cardiovascular: Negative.   Gastrointestinal: Negative.   Musculoskeletal: Positive for back pain and myalgias. Negative for  arthralgias, gait problem, joint swelling, neck pain and neck stiffness.  Skin: Negative.   Psychiatric/Behavioral: Negative.     Per HPI unless specifically indicated above     Objective:    BP (!) 142/92 (BP Location: Left Arm, Patient Position: Sitting)   Pulse 63   Temp 98.3 F (36.8 C) (Oral)   Ht 5\' 9"  (1.753 m)   Wt 162 lb (73.5 kg)   SpO2 98%   BMI 23.92 kg/m   Wt Readings from Last 3 Encounters:  11/20/19 162 lb (73.5 kg)  09/18/19 159 lb 6.4 oz (72.3 kg)  05/15/19 164 lb (74.4 kg)    Physical Exam Vitals and nursing note reviewed.  Constitutional:      General: He is not in acute distress.    Appearance: Normal appearance. He is not ill-appearing, toxic-appearing or diaphoretic.  HENT:     Head: Normocephalic and atraumatic.     Right Ear: External ear normal.     Left Ear: External ear normal.     Nose: Nose normal.     Mouth/Throat:     Mouth: Mucous membranes are moist.     Pharynx: Oropharynx is clear.  Eyes:     General: No scleral icterus.       Right eye: No discharge.        Left eye: No discharge.     Extraocular Movements: Extraocular movements intact.     Conjunctiva/sclera: Conjunctivae normal.     Pupils: Pupils are equal,  round, and reactive to light.  Cardiovascular:     Rate and Rhythm: Normal rate and regular rhythm.     Pulses: Normal pulses.     Heart sounds: Normal heart sounds. No murmur heard.  No friction rub. No gallop.   Pulmonary:     Effort: Pulmonary effort is normal. No respiratory distress.     Breath sounds: Normal breath sounds. No stridor. No wheezing, rhonchi or rales.  Chest:     Chest wall: No tenderness.  Musculoskeletal:        General: Normal range of motion.     Cervical back: Normal range of motion and neck supple.  Skin:    General: Skin is warm and dry.     Capillary Refill: Capillary refill takes less than 2 seconds.     Coloration: Skin is not jaundiced or pale.     Findings: No bruising, erythema,  lesion or rash.  Neurological:     General: No focal deficit present.     Mental Status: He is alert and oriented to person, place, and time. Mental status is at baseline.  Psychiatric:        Mood and Affect: Mood normal.        Behavior: Behavior normal.        Thought Content: Thought content normal.        Judgment: Judgment normal.     Results for orders placed or performed in visit on 51/76/16  Basic metabolic panel  Result Value Ref Range   Glucose 120 (H) 65 - 99 mg/dL   BUN 20 6 - 24 mg/dL   Creatinine, Ser 1.03 0.76 - 1.27 mg/dL   GFR calc non Af Amer 81 >59 mL/min/1.73   GFR calc Af Amer 93 >59 mL/min/1.73   BUN/Creatinine Ratio 19 9 - 20   Sodium 140 134 - 144 mmol/L   Potassium 4.4 3.5 - 5.2 mmol/L   Chloride 105 96 - 106 mmol/L   CO2 23 20 - 29 mmol/L   Calcium 9.3 8.7 - 10.2 mg/dL      Assessment & Plan:   Problem List Items Addressed This Visit      Nervous and Auditory   Lumbar radiculopathy    Not doing great. Continue to follow with ortho and PT. Call with any concerns. Continue to monitor.        Other   Tobacco use disorder - Primary    Didn't do well on the wellbutrin and patches. Did great on chantix, but needed it for too long for insurance to pay for it. Will continue to use it as able. Continue to cut down. Call with any concerns.        Other Visit Diagnoses    Need for immunization against influenza       Flu shot given today.   Relevant Orders   Flu Vaccine QUAD 36+ mos IM (Completed)   Gasping for breath       Unclear if allergies, sleep apnea or GERD. Will treat allergies and if not getting better will consider sleep study. Call with any concerns.        Follow up plan: Return in about 8 weeks (around 01/15/2020) for Physical.  25 minutes spent with patient today.

## 2019-11-20 NOTE — Assessment & Plan Note (Signed)
Not doing great. Continue to follow with ortho and PT. Call with any concerns. Continue to monitor.

## 2019-11-20 NOTE — Assessment & Plan Note (Signed)
Didn't do well on the wellbutrin and patches. Did great on chantix, but needed it for too long for insurance to pay for it. Will continue to use it as able. Continue to cut down. Call with any concerns.

## 2020-01-23 ENCOUNTER — Other Ambulatory Visit: Payer: Self-pay

## 2020-01-23 ENCOUNTER — Ambulatory Visit (INDEPENDENT_AMBULATORY_CARE_PROVIDER_SITE_OTHER): Payer: BC Managed Care – PPO | Admitting: Family Medicine

## 2020-01-23 ENCOUNTER — Encounter: Payer: Self-pay | Admitting: Family Medicine

## 2020-01-23 VITALS — BP 128/78 | HR 69 | Temp 98.1°F | Ht 69.5 in | Wt 161.0 lb

## 2020-01-23 DIAGNOSIS — Z0000 Encounter for general adult medical examination without abnormal findings: Secondary | ICD-10-CM | POA: Diagnosis not present

## 2020-01-23 DIAGNOSIS — K219 Gastro-esophageal reflux disease without esophagitis: Secondary | ICD-10-CM

## 2020-01-23 DIAGNOSIS — M5416 Radiculopathy, lumbar region: Secondary | ICD-10-CM

## 2020-01-23 DIAGNOSIS — I129 Hypertensive chronic kidney disease with stage 1 through stage 4 chronic kidney disease, or unspecified chronic kidney disease: Secondary | ICD-10-CM

## 2020-01-23 DIAGNOSIS — N181 Chronic kidney disease, stage 1: Secondary | ICD-10-CM

## 2020-01-23 DIAGNOSIS — M65342 Trigger finger, left ring finger: Secondary | ICD-10-CM

## 2020-01-23 LAB — MICROSCOPIC EXAMINATION
Bacteria, UA: NONE SEEN
Epithelial Cells (non renal): NONE SEEN /hpf (ref 0–10)
RBC, Urine: NONE SEEN /hpf (ref 0–2)
WBC, UA: NONE SEEN /hpf (ref 0–5)

## 2020-01-23 LAB — URINALYSIS, ROUTINE W REFLEX MICROSCOPIC
Bilirubin, UA: NEGATIVE
Glucose, UA: NEGATIVE
Ketones, UA: NEGATIVE
Leukocytes,UA: NEGATIVE
Nitrite, UA: NEGATIVE
Protein,UA: NEGATIVE
Specific Gravity, UA: 1.01 (ref 1.005–1.030)
Urobilinogen, Ur: 0.2 mg/dL (ref 0.2–1.0)
pH, UA: 5 (ref 5.0–7.5)

## 2020-01-23 LAB — MICROALBUMIN, URINE WAIVED
Creatinine, Urine Waived: 50 mg/dL (ref 10–300)
Microalb, Ur Waived: 80 mg/L — ABNORMAL HIGH (ref 0–19)
Microalb/Creat Ratio: >300 mg/g — ABNORMAL HIGH (ref <30)

## 2020-01-23 MED ORDER — LISINOPRIL 20 MG PO TABS
20.0000 mg | ORAL_TABLET | Freq: Every day | ORAL | 1 refills | Status: DC
Start: 2020-01-23 — End: 2020-07-25

## 2020-01-23 MED ORDER — DEXILANT 60 MG PO CPDR
60.0000 mg | DELAYED_RELEASE_CAPSULE | Freq: Every day | ORAL | 1 refills | Status: DC
Start: 2020-01-23 — End: 2020-07-25

## 2020-01-23 MED ORDER — NAPROXEN 500 MG PO TABS
500.0000 mg | ORAL_TABLET | Freq: Two times a day (BID) | ORAL | 1 refills | Status: DC
Start: 2020-01-23 — End: 2020-07-25

## 2020-01-23 NOTE — Progress Notes (Signed)
BP 128/78 (BP Location: Left Arm, Patient Position: Sitting, Cuff Size: Normal)   Pulse 69   Temp 98.1 F (36.7 C)   Ht 5' 9.5" (1.765 m)   Wt 161 lb (73 kg)   SpO2 98%   BMI 23.43 kg/m    Subjective:    Patient ID: Daniel Mcneil, male    DOB: February 13, 1960, 59 y.o.   MRN: [id number]  HPI: Daniel Mcneil is a 59 y.o. male presenting on 01/23/2020 for comprehensive medical examination. Current medical complaints include:  HYPERTENSION Hypertension status: controlled  Satisfied with current treatment? yes Duration of hypertension: chronic BP monitoring frequency:  not checking BP medication side effects:  no Medication compliance: excellent compliance Previous BP meds: lisinopril Aspirin: no Recurrent headaches: no Visual changes: no Palpitations: no Dyspnea: no Chest pain: no Lower extremity edema: no Dizzy/lightheaded: no  GERD GERD control status: controlled  Satisfied with current treatment? yes Heartburn frequency: not if he takes his medicine Medication side effects: no  Medication compliance: stable Dysphagia: no Odynophagia:  no Hematemesis: no Blood in stool: no EGD: no  He currently lives with: wife Interim Problems from his last visit: no  Depression Screen done today and results listed below:  Depression screen [facility] 2/9 01/23/2020 11/20/2019 03/22/2019 02/10/2018 08/19/2016  Decreased Interest 0 0 0 1 3  Down, Depressed, Hopeless 0 0 0 1 1  PHQ - 2 Score 0 0 0 2 4  Altered sleeping - 1 0 1 1  Tired, decreased energy - 1 0 1 3  Change in appetite - 0 0 1 1  Feeling bad or failure about yourself  - 0 0 0 0  Trouble concentrating - 0 0 0 0  Moving slowly or fidgety/restless - 0 0 0 1  Suicidal thoughts - 0 0 0 0  PHQ-9 Score - 2 0 5 10  Difficult doing work/chores - Not difficult at all Not difficult at all Somewhat difficult -    Past Medical History:  Past Medical History:  Diagnosis Date  . Chronic kidney disease, stage I   . Dysphagia   .  GERD (gastroesophageal reflux disease)   . Herniated disc, cervical   . Hypertension   . Hypertensive nephropathy   . Lumbar herniated disc   . Tobacco use disorder   . Urinary hesitancy     Surgical History:  Past Surgical History:  Procedure Laterality Date  . BIOPSY [name] 04/10/2016   Procedure: BIOPSY;  Surgeon: [name], MD;  Location: [place];  Service: Endoscopy;  Laterality: [name];  . COLONOSCOPY WITH PROPOFOL [name] 04/10/2016   Procedure: COLONOSCOPY WITH PROPOFOL;  Surgeon: [name], MD;  Location: [place];  Service: Endoscopy;  Laterality: [name];  requests - not first  . HERNIA REPAIR      Medications:  Current Outpatient Medications on File Prior to Visit  Medication Sig  . Aspirin-Salicylamide-Caffeine (BC HEADACHE POWDER PO) Take by mouth as needed.  . Multiple Vitamins-Minerals (EQ MULTIVITAMINS ADULT GUMMY PO) Take by mouth daily.   No current facility-administered medications on file prior to visit.    Allergies:  Allergies  Allergen Reactions  . Flomax [Tamsulosin]     dizziness  . Wellbutrin [Bupropion] Other (See Comments)    irritability    Social History:  Social History   Socioeconomic History  . Marital status: Married    Spouse name: Not on file  . Number of children: Not on file  . Years of education: Not on file  .  Highest education level: Not on file  Occupational History  . Not on file  Tobacco Use  . Smoking status: Current Every Day Smoker    Packs/day: 1.00    Years: 36.00    Pack years: 36.00    Types: Cigarettes  . Smokeless tobacco: Never Used  . Tobacco comment: since age 73.  Vaping Use  . Vaping Use: Never used  Substance and Sexual Activity  . Alcohol use: Yes    Alcohol/week: 15.0 standard drinks    Types: 15 Cans of beer per week    Comment:    . Drug use: No  . Sexual activity: Yes    Birth control/protection: None  Other Topics Concern  . Not on file  Social History Narrative  . Not on  file   Social Determinants of Health   Financial Resource Strain: Not on file  Food Insecurity: Not on file  Transportation Needs: Not on file  Physical Activity: Not on file  Stress: Not on file  Social Connections: Not on file  Intimate Partner Violence: Not on file   Social History   Tobacco Use  Smoking Status Current Every Day Smoker  . Packs/day: 1.00  . Years: 36.00  . Pack years: 36.00  . Types: Cigarettes  Smokeless Tobacco Never Used  Tobacco Comment   since age 84.   Social History   Substance and Sexual Activity  Alcohol Use Yes  . Alcohol/week: 15.0 standard drinks  . Types: 15 Cans of beer per week   Comment:      Family History:  Family History  Problem Relation Age of Onset  . Stroke Mother   . Hypertension Mother   . Cancer Father   . Hypertension Father   . Diabetes Father   . Heart disease Father   . Cancer Maternal Grandfather   . Hypertension Sister   . Cancer Sister        Lung cancer    Past medical history, surgical history, medications, allergies, family history and social history reviewed with patient today and changes made to appropriate areas of the chart.   Review of Systems  Constitutional: Negative.   HENT: Positive for hearing loss. Negative for congestion, ear discharge, ear pain, nosebleeds, sinus pain, sore throat and tinnitus.   Eyes: Negative.   Respiratory: Positive for cough. Negative for hemoptysis, sputum production, shortness of breath, wheezing and stridor.   Cardiovascular: Negative.   Gastrointestinal: Positive for abdominal pain, constipation and heartburn. Negative for blood in stool, diarrhea, melena, nausea and vomiting.  Genitourinary: Negative.   Musculoskeletal: Positive for back pain and myalgias. Negative for falls, joint pain and neck pain.  Skin: Negative.   Neurological: Negative.   Endo/Heme/Allergies: Negative.   Psychiatric/Behavioral: Negative.     All other ROS negative except what is listed  above and in the HPI.      Objective:    BP 128/78 (BP Location: Left Arm, Patient Position: Sitting, Cuff Size: Normal)   Pulse 69   Temp 98.1 F (36.7 C)   Ht 5' 9.5" (1.765 m)   Wt 161 lb (73 kg)   SpO2 98%   BMI 23.43 kg/m   Wt Readings from Last 3 Encounters:  01/23/20 161 lb (73 kg)  11/20/19 162 lb (73.5 kg)  09/18/19 159 lb 6.4 oz (72.3 kg)    Physical Exam Vitals and nursing note reviewed.  Constitutional:      General: He is not in acute distress.    Appearance:  Normal appearance. He is normal weight. He is not ill-appearing, toxic-appearing or diaphoretic.  HENT:     Head: Normocephalic and atraumatic.     Right Ear: Tympanic membrane, ear canal and external ear normal. There is no impacted cerumen.     Left Ear: Tympanic membrane, ear canal and external ear normal. There is no impacted cerumen.     Nose: Nose normal. No congestion or rhinorrhea.     Mouth/Throat:     Mouth: Mucous membranes are moist.     Pharynx: Oropharynx is clear. No oropharyngeal exudate or posterior oropharyngeal erythema.  Eyes:     General: No scleral icterus.       Right eye: No discharge.        Left eye: No discharge.     Extraocular Movements: Extraocular movements intact.     Conjunctiva/sclera: Conjunctivae normal.     Pupils: Pupils are equal, round, and reactive to light.  Neck:     Vascular: No carotid bruit.  Cardiovascular:     Rate and Rhythm: Normal rate and regular rhythm.     Pulses: Normal pulses.     Heart sounds: No murmur heard. No friction rub. No gallop.   Pulmonary:     Effort: Pulmonary effort is normal. No respiratory distress.     Breath sounds: Normal breath sounds. No stridor. No wheezing, rhonchi or rales.  Chest:     Chest wall: No tenderness.  Abdominal:     General: Abdomen is flat. Bowel sounds are normal. There is no distension.     Palpations: Abdomen is soft. There is no mass.     Tenderness: There is no abdominal tenderness. There is no  right CVA tenderness, left CVA tenderness, guarding or rebound.     Hernia: No hernia is present.  Genitourinary:    Comments: Genital exam deferred with shared decision making Musculoskeletal:        General: No swelling, tenderness, deformity or signs of injury.     Cervical back: Normal range of motion and neck supple. No rigidity. No muscular tenderness.     Right lower leg: No edema.     Left lower leg: No edema.  Lymphadenopathy:     Cervical: No cervical adenopathy.  Skin:    General: Skin is warm and dry.     Capillary Refill: Capillary refill takes less than 2 seconds.     Coloration: Skin is not jaundiced or pale.     Findings: No bruising, erythema, lesion or rash.  Neurological:     General: No focal deficit present.     Mental Status: He is alert and oriented to person, place, and time.     Cranial Nerves: No cranial nerve deficit.     Sensory: No sensory deficit.     Motor: No weakness.     Coordination: Coordination normal.     Gait: Gait normal.     Deep Tendon Reflexes: Reflexes normal.  Psychiatric:        Mood and Affect: Mood normal.        Behavior: Behavior normal.        Thought Content: Thought content normal.        Judgment: Judgment normal.     Results for orders placed or performed in visit on 0000000  Basic metabolic panel  Result Value Ref Range   Glucose 120 (H) 65 - 99 mg/dL   BUN 20 6 - 24 mg/dL   Creatinine, Ser 1.03 0.76 - 1.27  mg/dL   GFR calc non Af Amer 81 >59 mL/min/1.73   GFR calc Af Amer 93 >59 mL/min/1.73   BUN/Creatinine Ratio 19 9 - 20   Sodium 140 134 - 144 mmol/L   Potassium 4.4 3.5 - 5.2 mmol/L   Chloride 105 96 - 106 mmol/L   CO2 23 20 - 29 mmol/L   Calcium 9.3 8.7 - 10.2 mg/dL      Assessment & Plan:   Problem List Items Addressed This Visit      Digestive   GERD (gastroesophageal reflux disease)    Under good control on current regimen. Continue current regimen. Continue to monitor. Call with any concerns.  Refills given. Labs drawn today.       Relevant Medications   dexlansoprazole (DEXILANT) 60 MG capsule   Other Relevant Orders   Comprehensive metabolic panel   CBC with Differential/Platelet     Nervous and Auditory   Lumbar radiculopathy    Doing well with PT. Continue to monitor. Follow up with ortho as needed.       Relevant Orders   Comprehensive metabolic panel     Genitourinary   Benign hypertensive renal disease    Under good control on current regimen. Continue current regimen. Continue to monitor. Call with any concerns. Refills given. Labs drawn today.      Relevant Orders   Comprehensive metabolic panel   TSH   Microalbumin, Urine Waived   Chronic kidney disease, stage I    Labs drawn today. Await results.       Relevant Orders   Comprehensive metabolic panel    Other Visit Diagnoses    Routine general medical examination at a health care facility    -  Primary   Vaccines up to date. Screening labs checked today. Colonoscopy up to date. Continue diet and exercise. Call with any concerns.    Relevant Orders   Comprehensive metabolic panel   CBC with Differential/Platelet   Lipid Panel w/o Chol/HDL Ratio   PSA   TSH   Urinalysis, Routine w reflex microscopic   Microalbumin, Urine Waived   Trigger ring finger of left hand       Nto bad enough to go see ortho yet- will let us know if worsening.       LABORATORY TESTING:  Health maintenance labs ordered today as discussed above.   The natural history of prostate cancer and ongoing controversy regarding screening and potential treatment outcomes of prostate cancer has been discussed with the patient. The meaning of a false positive PSA and a false negative PSA has been discussed. He indicates understanding of the limitations of this screening test and wishes to proceed with screening PSA testing.   IMMUNIZATIONS:  - Tdap: Tetanus vaccination status reviewed: last tetanus booster within 10 years. -  Influenza: Up to date - Pneumovax: Up to date - Prevnar: Not applicable - COVID: Up to date  SCREENING: - Colonoscopy: Up to date  Discussed with patient purpose of the colonoscopy is to detect colon cancer at curable precancerous or early stages   PATIENT COUNSELING:    Sexuality: Discussed sexually transmitted diseases, partner selection, use of condoms, avoidance of unintended pregnancy  and contraceptive alternatives.   Advised to avoid cigarette smoking.  I discussed with the patient that most people either abstain from alcohol or drink within safe limits (<=14/week and <=4 drinks/occasion for males, <=7/weeks and <= 3 drinks/occasion for females) and that the risk for alcohol disorders and other health effects  rises proportionally with the number of drinks per week and how often a drinker exceeds daily limits.  Discussed cessation/primary prevention of drug use and availability of treatment for abuse.   Diet: Encouraged to adjust caloric intake to maintain  or achieve ideal body weight, to reduce intake of dietary saturated fat and total fat, to limit sodium intake by avoiding high sodium foods and not adding table salt, and to maintain adequate dietary potassium and calcium preferably from fresh fruits, vegetables, and low-fat dairy products.    stressed the importance of regular exercise  Injury prevention: Discussed safety belts, safety helmets, smoke detector, smoking near bedding or upholstery.   Dental health: Discussed importance of regular tooth brushing, flossing, and dental visits.   Follow up plan: NEXT PREVENTATIVE PHYSICAL DUE IN 1 YEAR. Return in about 6 months (around 07/23/2020).

## 2020-01-23 NOTE — Patient Instructions (Addendum)

## 2020-01-23 NOTE — Assessment & Plan Note (Signed)
Under good control on current regimen. Continue current regimen. Continue to monitor. Call with any concerns. Refills given. Labs drawn today.   

## 2020-01-23 NOTE — Assessment & Plan Note (Signed)
Labs drawn today. Await results.  

## 2020-01-23 NOTE — Assessment & Plan Note (Signed)
Doing well with PT. Continue to monitor. Follow up with ortho as needed.

## 2020-01-24 LAB — CBC WITH DIFFERENTIAL/PLATELET
Basophils Absolute: 0 10*3/uL (ref 0.0–0.2)
Basos: 1 %
EOS (ABSOLUTE): 0.1 10*3/uL (ref 0.0–0.4)
Eos: 2 %
Hematocrit: 48.3 % (ref 37.5–51.0)
Hemoglobin: 16.6 g/dL (ref 13.0–17.7)
Immature Grans (Abs): 0 10*3/uL (ref 0.0–0.1)
Immature Granulocytes: 0 %
Lymphocytes Absolute: 1.2 10*3/uL (ref 0.7–3.1)
Lymphs: 17 %
MCH: 31.7 pg (ref 26.6–33.0)
MCHC: 34.4 g/dL (ref 31.5–35.7)
MCV: 92 fL (ref 79–97)
Monocytes Absolute: 0.7 10*3/uL (ref 0.1–0.9)
Monocytes: 10 %
Neutrophils Absolute: 5.1 10*3/uL (ref 1.4–7.0)
Neutrophils: 70 %
Platelets: 237 10*3/uL (ref 150–450)
RBC: 5.23 x10E6/uL (ref 4.14–5.80)
RDW: 13.1 % (ref 11.6–15.4)
WBC: 7.2 10*3/uL (ref 3.4–10.8)

## 2020-01-24 LAB — COMPREHENSIVE METABOLIC PANEL
ALT: 27 IU/L (ref 0–44)
AST: 35 IU/L (ref 0–40)
Albumin/Globulin Ratio: 1.6 (ref 1.2–2.2)
Albumin: 4.6 g/dL (ref 3.8–4.9)
Alkaline Phosphatase: 94 IU/L (ref 44–121)
BUN/Creatinine Ratio: 15 (ref 9–20)
BUN: 15 mg/dL (ref 6–24)
Bilirubin Total: <0.2 mg/dL (ref 0.0–1.2)
CO2: 21 mmol/L (ref 20–29)
Calcium: 9.7 mg/dL (ref 8.7–10.2)
Chloride: 108 mmol/L — ABNORMAL HIGH (ref 96–106)
Creatinine, Ser: 0.97 mg/dL (ref 0.76–1.27)
GFR calc Af Amer: 100 mL/min/{1.73_m2} (ref >59)
GFR calc non Af Amer: 86 mL/min/{1.73_m2} (ref >59)
Globulin, Total: 2.8 g/dL (ref 1.5–4.5)
Glucose: 83 mg/dL (ref 65–99)
Potassium: 4.6 mmol/L (ref 3.5–5.2)
Sodium: 143 mmol/L (ref 134–144)
Total Protein: 7.4 g/dL (ref 6.0–8.5)

## 2020-01-24 LAB — LIPID PANEL W/O CHOL/HDL RATIO
Cholesterol, Total: 232 mg/dL — ABNORMAL HIGH (ref 100–199)
HDL: 40 mg/dL (ref >39)
LDL Chol Calc (NIH): 179 mg/dL — ABNORMAL HIGH (ref 0–99)
Triglycerides: 75 mg/dL (ref 0–149)
VLDL Cholesterol Cal: 13 mg/dL (ref 5–40)

## 2020-01-24 LAB — PSA: Prostate Specific Ag, Serum: 0.3 ng/mL (ref 0.0–4.0)

## 2020-01-24 LAB — TSH: TSH: 1.02 u[IU]/mL (ref 0.450–4.500)

## 2020-01-25 ENCOUNTER — Encounter: Payer: Self-pay | Admitting: Family Medicine

## 2020-03-05 ENCOUNTER — Telehealth: Payer: Self-pay

## 2020-03-05 NOTE — Telephone Encounter (Signed)
Copied from [place] [id number]. Topic: General - Other >> Mar 04, 2020  4:01 PM Leward Quan A wrote: Reason for CRM: Patient wife Helene Kelp called in to speak to the office manager of person that handles the billing because her husband was billed for something sent to the lab from his last visit on 01/23/20. Please call Helene Kelp at Ph# [phone]

## 2020-04-17 ENCOUNTER — Telehealth: Payer: Self-pay

## 2020-04-17 NOTE — Telephone Encounter (Signed)
Copied from [place] [id number]. Topic: Complaint - Billing/Coding >> Apr 17, 2020 12:18 PM Erick Blinks wrote: Tests not supported by diagnosis code, Insurance company denied tests.  DOS: 01/22/2021 Details of complaint:  How would the patient like to see this issue resolved? Please call back, patient wants to discuss this with Practice Admin.  Best contact: [phone]  Route to Practice Administrator.

## 2020-04-17 NOTE — Telephone Encounter (Signed)
Contacted [facility] per patients request. [facility] is reprocessing the claim for DOS 01/23/2020. Patient notified.

## 2020-04-19 NOTE — Telephone Encounter (Signed)
Call returned and issue resolved

## 2020-05-14 ENCOUNTER — Other Ambulatory Visit (INDEPENDENT_AMBULATORY_CARE_PROVIDER_SITE_OTHER): Payer: Self-pay | Admitting: Nurse Practitioner

## 2020-05-14 DIAGNOSIS — I714 Abdominal aortic aneurysm, without rupture, unspecified: Secondary | ICD-10-CM

## 2020-05-16 ENCOUNTER — Ambulatory Visit (INDEPENDENT_AMBULATORY_CARE_PROVIDER_SITE_OTHER): Payer: BC Managed Care – PPO | Admitting: Nurse Practitioner

## 2020-05-16 ENCOUNTER — Ambulatory Visit (INDEPENDENT_AMBULATORY_CARE_PROVIDER_SITE_OTHER): Payer: BC Managed Care – PPO

## 2020-05-16 ENCOUNTER — Other Ambulatory Visit: Payer: Self-pay

## 2020-05-16 ENCOUNTER — Encounter (INDEPENDENT_AMBULATORY_CARE_PROVIDER_SITE_OTHER): Payer: Self-pay | Admitting: Nurse Practitioner

## 2020-05-16 VITALS — BP 140/93 | HR 75 | Resp 16 | Wt 163.0 lb

## 2020-05-16 DIAGNOSIS — M79604 Pain in right leg: Secondary | ICD-10-CM | POA: Diagnosis not present

## 2020-05-16 DIAGNOSIS — I714 Abdominal aortic aneurysm, without rupture, unspecified: Secondary | ICD-10-CM

## 2020-05-16 DIAGNOSIS — F17200 Nicotine dependence, unspecified, uncomplicated: Secondary | ICD-10-CM | POA: Diagnosis not present

## 2020-05-16 DIAGNOSIS — M5416 Radiculopathy, lumbar region: Secondary | ICD-10-CM | POA: Diagnosis not present

## 2020-05-16 DIAGNOSIS — M79605 Pain in left leg: Secondary | ICD-10-CM

## 2020-05-18 NOTE — Progress Notes (Signed)
Subjective:    Patient ID: Daniel Mcneil, male    DOB: 02/08/1960, 60 y.o.   MRN: [id number] Chief Complaint  Patient presents with  . Follow-up    Ultrasound follow up    The patient returns to the office for surveillance of a known abdominal aortic aneurysm. Patient denies abdominal pain or back pain, no other abdominal complaints. No changes suggesting embolic episodes.   There have been no interval changes in the patient's overall health care since his last visit.  Patient denies amaurosis fugax or TIA symptoms.  The patient denies angina or shortness of breath.   Duplex US of the aorta and iliac arteries shows an AAA measured 3.01, measuring mildly ectatic.  This is slightly decreased from previous diameter of 3.2.  There is also mild dilatation of the proximal common iliac arteries.  Its essentially unchanged from last year.  The patient does endorse having some right lower extremity numbness all the time.  He is also concerned due to the fact that this leg appears to be somewhat smaller and it becomes discolored.  He notices this mostly after showering.  The left lower extremity does have a large varicosity in the medial area.  Patient is concerned this could be related to circulatory issues.   Review of Systems  Cardiovascular: Negative for leg swelling.  Skin: Positive for color change.  Neurological: Positive for numbness.  All other systems reviewed and are negative.      Objective:   Physical Exam Vitals reviewed.  HENT:     Head: Normocephalic.  Cardiovascular:     Rate and Rhythm: Normal rate and regular rhythm.     Pulses:          Dorsalis pedis pulses are 1+ on the right side and 1+ on the left side.       Posterior tibial pulses are 0 on the right side and 0 on the left side.  Pulmonary:     Effort: Pulmonary effort is normal.  Musculoskeletal:     Right lower leg: No edema.     Left lower leg: No edema.  Neurological:     Mental Status: He is alert and  oriented to person, place, and time.  Psychiatric:        Mood and Affect: Mood normal.        Behavior: Behavior normal.        Thought Content: Thought content normal.        Judgment: Judgment normal.     BP (!) 140/93 (BP Location: Left Arm)   Pulse 75   Resp 16   Wt 163 lb (73.9 kg)   BMI 23.73 kg/m   Past Medical History:  Diagnosis Date  . Chronic kidney disease, stage I   . Dysphagia   . GERD (gastroesophageal reflux disease)   . Herniated disc, cervical   . Hypertension   . Hypertensive nephropathy   . Lumbar herniated disc   . Tobacco use disorder   . Urinary hesitancy     Social History   Socioeconomic History  . Marital status: Married    Spouse name: Not on file  . Number of children: Not on file  . Years of education: Not on file  . Highest education level: Not on file  Occupational History  . Not on file  Tobacco Use  . Smoking status: Current Every Day Smoker    Packs/day: 1.00    Years: 36.00    Pack years: 36.00  Types: Cigarettes  . Smokeless tobacco: Never Used  . Tobacco comment: since age 38.  Vaping Use  . Vaping Use: Never used  Substance and Sexual Activity  . Alcohol use: Yes    Alcohol/week: 15.0 standard drinks    Types: 15 Cans of beer per week    Comment:    . Drug use: No  . Sexual activity: Yes    Birth control/protection: None  Other Topics Concern  . Not on file  Social History Narrative  . Not on file   Social Determinants of Health   Financial Resource Strain: Not on file  Food Insecurity: Not on file  Transportation Needs: Not on file  Physical Activity: Not on file  Stress: Not on file  Social Connections: Not on file  Intimate Partner Violence: Not on file    Past Surgical History:  Procedure Laterality Date  . BIOPSY [name] 04/10/2016   Procedure: BIOPSY;  Surgeon: [name], MD;  Location: [place];  Service: Endoscopy;  Laterality: [name];  . COLONOSCOPY WITH PROPOFOL [name] 04/10/2016    Procedure: COLONOSCOPY WITH PROPOFOL;  Surgeon: [name], MD;  Location: [place];  Service: Endoscopy;  Laterality: [name];  requests - not first  . HERNIA REPAIR      Family History  Problem Relation Age of Onset  . Stroke Mother   . Hypertension Mother   . Cancer Father   . Hypertension Father   . Diabetes Father   . Heart disease Father   . Cancer Maternal Grandfather   . Hypertension Sister   . Cancer Sister        Lung cancer    Allergies  Allergen Reactions  . Flomax [Tamsulosin]     dizziness  . Wellbutrin [Bupropion] Other (See Comments)    irritability    CBC Latest Ref Rng & Units 01/23/2020 12/13/2018 12/13/2017  WBC 3.4 - 10.8 x10E3/uL 7.2 9.4 6.6  Hemoglobin 13.0 - 17.7 g/dL 16.6 17.1 15.3  Hematocrit 37.5 - 51.0 % 48.3 49.8 45.2  Platelets 150 - 450 x10E3/uL 237 233 215      CMP     Component Value Date/Time   NA 143 01/23/2020 1209   K 4.6 01/23/2020 1209   CL 108 (H) 01/23/2020 1209   CO2 21 01/23/2020 1209   GLUCOSE 83 01/23/2020 1209   BUN 15 01/23/2020 1209   CREATININE 0.97 01/23/2020 1209   CALCIUM 9.7 01/23/2020 1209   PROT 7.4 01/23/2020 1209   ALBUMIN 4.6 01/23/2020 1209   AST 35 01/23/2020 1209   ALT 27 01/23/2020 1209   ALKPHOS 94 01/23/2020 1209   BILITOT <0.2 01/23/2020 1209   GFRNONAA 86 01/23/2020 1209   GFRAA 100 01/23/2020 1209     No results found.     Assessment & Plan:   1. Abdominal aortic aneurysm (AAA) without rupture ([place]) No surgery or intervention at this time. The patient has an asymptomatic abdominal aortic aneurysm that is less than 4 cm in maximal diameter.  I have discussed the natural history of abdominal aortic aneurysm and the small risk of rupture for aneurysm less than 5 cm in size.  However, as these small aneurysms tend to enlarge over time, continued surveillance with ultrasound or CT scan is mandatory.  I have also discussed optimizing medical management with hypertension and lipid  control and the importance of abstinence from tobacco.  The patient is also encouraged to exercise a minimum of 30 minutes 4 times a week.  Should the patient develop new onset abdominal or back pain or signs of peripheral embolization they are instructed to seek medical attention immediately and to alert the physician providing care that they have an aneurysm.  The patient voices their understanding. The patient will return in 12 months with an aortic duplex.   2. Tobacco use disorder Smoking cessation was discussed, 3-10 minutes spent on this topic specifically   3. Lumbar radiculopathy This may be part of the cause for the patient's lower extremity numbness.  4. Pain in both lower extremities  Recommend:  The patient has atypical pain symptoms for pure atherosclerotic disease. However, on physical exam there is evidence of mixed venous and arterial disease, given the diminished pulses and the edema associated with venous changes of the legs.  Noninvasive studies including ABI's and venous ultrasound of the legs will be obtained and the patient will follow up with me to review these studies.  I suspect the patient is c/o pseudoclaudication.  Patient should have an evaluation of his LS spine which I defer to the primary service.  The patient should continue walking and begin a more formal exercise program. The patient should continue his antiplatelet therapy and aggressive treatment of the lipid abnormalities.  The patient should begin wearing graduated compression socks 15-20 mmHg strength to control edema.    The patient notes that he needs to evaluate his schedule but he will contact our office when he is ready to schedule follow-up. Current Outpatient Medications on File Prior to Visit  Medication Sig Dispense Refill  . Aspirin-Salicylamide-Caffeine (BC HEADACHE POWDER PO) Take by mouth as needed.    Marland Kitchen dexlansoprazole (DEXILANT) 60 MG capsule Take 1 capsule (60 mg total) by mouth  daily. 90 capsule 1  . lisinopril (ZESTRIL) 20 MG tablet Take 1 tablet (20 mg total) by mouth daily. 90 tablet 1  . Multiple Vitamins-Minerals (EQ MULTIVITAMINS ADULT GUMMY PO) Take by mouth daily.    . naproxen (NAPROSYN) 500 MG tablet Take 1 tablet (500 mg total) by mouth 2 (two) times daily with a meal. 180 tablet 1   No current facility-administered medications on file prior to visit.    There are no Patient Instructions on file for this visit. No follow-ups on file.   Kris Hartmann, NP

## 2020-05-19 ENCOUNTER — Encounter (INDEPENDENT_AMBULATORY_CARE_PROVIDER_SITE_OTHER): Payer: Self-pay | Admitting: Nurse Practitioner

## 2020-07-25 ENCOUNTER — Other Ambulatory Visit: Payer: Self-pay

## 2020-07-25 ENCOUNTER — Ambulatory Visit: Payer: BC Managed Care – PPO | Admitting: Family Medicine

## 2020-07-25 ENCOUNTER — Encounter: Payer: Self-pay | Admitting: Family Medicine

## 2020-07-25 VITALS — BP 109/75 | HR 68 | Temp 97.9°F | Ht 70.0 in | Wt 158.2 lb

## 2020-07-25 DIAGNOSIS — K219 Gastro-esophageal reflux disease without esophagitis: Secondary | ICD-10-CM | POA: Diagnosis not present

## 2020-07-25 DIAGNOSIS — I129 Hypertensive chronic kidney disease with stage 1 through stage 4 chronic kidney disease, or unspecified chronic kidney disease: Secondary | ICD-10-CM

## 2020-07-25 DIAGNOSIS — M5416 Radiculopathy, lumbar region: Secondary | ICD-10-CM

## 2020-07-25 MED ORDER — NAPROXEN 500 MG PO TABS
500.0000 mg | ORAL_TABLET | Freq: Two times a day (BID) | ORAL | 1 refills | Status: DC
Start: 2020-07-25 — End: 2021-01-30

## 2020-07-25 MED ORDER — PANTOPRAZOLE SODIUM 40 MG PO TBEC: 40.0000 mg | DELAYED_RELEASE_TABLET | Freq: Every day | ORAL | 1 refills | Status: DC

## 2020-07-25 MED ORDER — LISINOPRIL 20 MG PO TABS
20.0000 mg | ORAL_TABLET | Freq: Every day | ORAL | 1 refills | Status: DC
Start: 2020-07-25 — End: 2021-01-29

## 2020-07-25 NOTE — Progress Notes (Signed)
BP 109/75   Pulse 68   Temp 97.9 F (36.6 C)   Ht 5\' 10"  (1.778 m)   Wt 158 lb 3.2 oz (71.8 kg)   SpO2 97%   BMI 22.70 kg/m    Subjective:    Patient ID: Daniel Mcneil, male    DOB: Feb 16, 1960, 60 y.o.   MRN: [id number]  HPI: Daniel Mcneil is a 60 y.o. male  Chief Complaint  Patient presents with   Hypertension   HYPERTENSION Hypertension status: controlled  Satisfied with current treatment? yes Duration of hypertension: chronic BP monitoring frequency:  not checking BP medication side effects:  no Medication compliance: excellent compliance Previous BP meds: lisinopril Aspirin: no Recurrent headaches: no Visual changes: no Palpitations: no Dyspnea: no Chest pain: no Lower extremity edema: no Dizzy/lightheaded: no  GERD GERD control status: controlled Satisfied with current treatment? yes Heartburn frequency:  Medication side effects: no  Medication compliance: excellent Dysphagia: yes Odynophagia:  no Hematemesis: no Blood in stool: no EGD: yes   Relevant past medical, surgical, family and social history reviewed and updated as indicated. Interim medical history since our last visit reviewed. Allergies and medications reviewed and updated.  Review of Systems  Constitutional: Negative.   Respiratory: Negative.    Cardiovascular: Negative.   Gastrointestinal: Negative.   Musculoskeletal: Negative.   Neurological: Negative.   Psychiatric/Behavioral: Negative.     Per HPI unless specifically indicated above     Objective:    BP 109/75   Pulse 68   Temp 97.9 F (36.6 C)   Ht 5\' 10"  (1.778 m)   Wt 158 lb 3.2 oz (71.8 kg)   SpO2 97%   BMI 22.70 kg/m   Wt Readings from Last 3 Encounters:  07/25/20 158 lb 3.2 oz (71.8 kg)  05/16/20 163 lb (73.9 kg)  01/23/20 161 lb (73 kg)    Physical Exam Vitals and nursing note reviewed.  Constitutional:      General: He is not in acute distress.    Appearance: Normal appearance. He is not ill-appearing,  toxic-appearing or diaphoretic.  HENT:     Head: Normocephalic and atraumatic.     Right Ear: External ear normal.     Left Ear: External ear normal.     Nose: Nose normal.     Mouth/Throat:     Mouth: Mucous membranes are moist.     Pharynx: Oropharynx is clear.  Eyes:     General: No scleral icterus.       Right eye: No discharge.        Left eye: No discharge.     Extraocular Movements: Extraocular movements intact.     Conjunctiva/sclera: Conjunctivae normal.     Pupils: Pupils are equal, round, and reactive to light.  Cardiovascular:     Rate and Rhythm: Normal rate and regular rhythm.     Pulses: Normal pulses.     Heart sounds: Normal heart sounds. No murmur heard.   No friction rub. No gallop.  Pulmonary:     Effort: Pulmonary effort is normal. No respiratory distress.     Breath sounds: Normal breath sounds. No stridor. No wheezing, rhonchi or rales.  Chest:     Chest wall: No tenderness.  Musculoskeletal:        General: Normal range of motion.     Cervical back: Normal range of motion and neck supple.  Skin:    General: Skin is warm and dry.     Capillary Refill: Capillary refill takes  less than 2 seconds.     Coloration: Skin is not jaundiced or pale.     Findings: No bruising, erythema, lesion or rash.  Neurological:     General: No focal deficit present.     Mental Status: He is alert and oriented to person, place, and time. Mental status is at baseline.  Psychiatric:        Mood and Affect: Mood normal.        Behavior: Behavior normal.        Thought Content: Thought content normal.        Judgment: Judgment normal.    Results for orders placed or performed in visit on 01/23/20  Microscopic Examination   Urine  Result Value Ref Range   WBC, UA None seen 0 - 5 /hpf   RBC None seen 0 - 2 /hpf   Epithelial Cells (non renal) None seen 0 - 10 /hpf   Bacteria, UA None seen None seen/Few  Comprehensive metabolic panel  Result Value Ref Range   Glucose 83  65 - 99 mg/dL   BUN 15 6 - 24 mg/dL   Creatinine, Ser 0.97 0.76 - 1.27 mg/dL   GFR calc non Af Amer 86 >59 mL/min/1.73   GFR calc Af Amer 100 >59 mL/min/1.73   BUN/Creatinine Ratio 15 9 - 20   Sodium 143 134 - 144 mmol/L   Potassium 4.6 3.5 - 5.2 mmol/L   Chloride 108 (H) 96 - 106 mmol/L   CO2 21 20 - 29 mmol/L   Calcium 9.7 8.7 - 10.2 mg/dL   Total Protein 7.4 6.0 - 8.5 g/dL   Albumin 4.6 3.8 - 4.9 g/dL   Globulin, Total 2.8 1.5 - 4.5 g/dL   Albumin/Globulin Ratio 1.6 1.2 - 2.2   Bilirubin Total <0.2 0.0 - 1.2 mg/dL   Alkaline Phosphatase 94 44 - 121 IU/L   AST 35 0 - 40 IU/L   ALT 27 0 - 44 IU/L  CBC with Differential/Platelet  Result Value Ref Range   WBC 7.2 3.4 - 10.8 x10E3/uL   RBC 5.23 4.14 - 5.80 x10E6/uL   Hemoglobin 16.6 13.0 - 17.7 g/dL   Hematocrit 48.3 37.5 - 51.0 %   MCV 92 79 - 97 fL   MCH 31.7 26.6 - 33.0 pg   MCHC 34.4 31.5 - 35.7 g/dL   RDW 13.1 11.6 - 15.4 %   Platelets 237 150 - 450 x10E3/uL   Neutrophils 70 Not Estab. %   Lymphs 17 Not Estab. %   Monocytes 10 Not Estab. %   Eos 2 Not Estab. %   Basos 1 Not Estab. %   Neutrophils Absolute 5.1 1.4 - 7.0 x10E3/uL   Lymphocytes Absolute 1.2 0.7 - 3.1 x10E3/uL   Monocytes Absolute 0.7 0.1 - 0.9 x10E3/uL   EOS (ABSOLUTE) 0.1 0.0 - 0.4 x10E3/uL   Basophils Absolute 0.0 0.0 - 0.2 x10E3/uL   Immature Granulocytes 0 Not Estab. %   Immature Grans (Abs) 0.0 0.0 - 0.1 x10E3/uL  Lipid Panel w/o Chol/HDL Ratio  Result Value Ref Range   Cholesterol, Total 232 (H) 100 - 199 mg/dL   Triglycerides 75 0 - 149 mg/dL   HDL 40 >39 mg/dL   VLDL Cholesterol Cal 13 5 - 40 mg/dL   LDL Chol Calc (NIH) 179 (H) 0 - 99 mg/dL  PSA  Result Value Ref Range   Prostate Specific Ag, Serum 0.3 0.0 - 4.0 ng/mL  TSH  Result Value Ref Range  TSH 1.020 0.450 - 4.500 uIU/mL  Urinalysis, Routine w reflex microscopic  Result Value Ref Range   Specific Gravity, UA 1.010 1.005 - 1.030   pH, UA 5.0 5.0 - 7.5   Color, UA Yellow Yellow    Appearance Ur Clear Clear   Leukocytes,UA Negative Negative   Protein,UA Negative Negative/Trace   Glucose, UA Negative Negative   Ketones, UA Negative Negative   RBC, UA Trace (A) Negative   Bilirubin, UA Negative Negative   Urobilinogen, Ur 0.2 0.2 - 1.0 mg/dL   Nitrite, UA Negative Negative   Microscopic Examination See below:   Microalbumin, Urine Waived  Result Value Ref Range   Microalb, Ur Waived 80 (H) 0 - 19 mg/L   Creatinine, Urine Waived 50 10 - 300 mg/dL   Microalb/Creat Ratio >300 (H) <30 mg/g      Assessment & Plan:   Problem List Items Addressed This Visit       Digestive   GERD (gastroesophageal reflux disease)    Insurance will not cover dexilant. Will try pantoprazole. Has failed omeprazole in the past. Call with any concerns. Continue to monitor.        Relevant Medications   pantoprazole (PROTONIX) 40 MG tablet     Nervous and Auditory   Lumbar radiculopathy    Stable. No significant change. Continue to monitor.          Genitourinary   Benign hypertensive renal disease - Primary    Under good control on current regimen. Continue current regimen. Continue to monitor. Call with any concerns. Refills given. Labs drawn today.        Relevant Orders   Basic metabolic panel     Follow up plan: Return in about 6 months (around 01/24/2021) for physical.

## 2020-07-25 NOTE — Assessment & Plan Note (Signed)
Stable. No significant change. Continue to monitor.

## 2020-07-25 NOTE — Assessment & Plan Note (Signed)
Under good control on current regimen. Continue current regimen. Continue to monitor. Call with any concerns. Refills given. Labs drawn today.   

## 2020-07-25 NOTE — Assessment & Plan Note (Signed)
Insurance will not cover dexilant. Will try pantoprazole. Has failed omeprazole in the past. Call with any concerns. Continue to monitor.

## 2020-07-26 LAB — BASIC METABOLIC PANEL
BUN/Creatinine Ratio: 22 — ABNORMAL HIGH (ref 9–20)
BUN: 20 mg/dL (ref 6–24)
CO2: 19 mmol/L — ABNORMAL LOW (ref 20–29)
Calcium: 9.2 mg/dL (ref 8.7–10.2)
Chloride: 108 mmol/L — ABNORMAL HIGH (ref 96–106)
Creatinine, Ser: 0.89 mg/dL (ref 0.76–1.27)
Glucose: 92 mg/dL (ref 65–99)
Potassium: 4.2 mmol/L (ref 3.5–5.2)
Sodium: 144 mmol/L (ref 134–144)
eGFR: 100 mL/min/{1.73_m2} (ref >59)

## 2020-07-30 ENCOUNTER — Encounter: Payer: Self-pay | Admitting: Family Medicine

## 2021-01-27 ENCOUNTER — Other Ambulatory Visit: Payer: Self-pay | Admitting: Family Medicine

## 2021-01-29 NOTE — Telephone Encounter (Signed)
Requested Prescriptions  Pending Prescriptions Disp Refills   lisinopril (ZESTRIL) 20 MG tablet [Pharmacy Med Name: Lisinopril 20 MG Oral Tablet] 90 tablet 0    Sig: Take 1 tablet by mouth once daily     Cardiovascular:  ACE Inhibitors Failed - 01/27/2021 11:49 AM      Failed - Cr in normal range and within 180 days    Creatinine, Ser  Date Value Ref Range Status  07/25/2020 0.89 0.76 - 1.27 mg/dL Final         Failed - K in normal range and within 180 days    Potassium  Date Value Ref Range Status  07/25/2020 4.2 3.5 - 5.2 mmol/L Final         Failed - Valid encounter within last 6 months    Recent Outpatient Visits          6 months ago Benign hypertensive renal disease   [facility] [place], Megan P, DO   1 year ago Routine general medical examination at a health care facility   [facility], [place] P, DO   1 year ago Tobacco use disorder   [place], [place], DO   1 year ago Benign hypertensive renal disease   [facility] [place], [place], DO   1 year ago Tobacco use disorder   [place], [place], DO      Future Appointments            Tomorrow [place], [place], DO [place], [place] - Patient is not pregnant      Passed - Last BP in normal range    BP Readings from Last 1 Encounters:  07/25/20 109/75

## 2021-01-30 ENCOUNTER — Other Ambulatory Visit: Payer: Self-pay

## 2021-01-30 ENCOUNTER — Encounter: Payer: Self-pay | Admitting: Family Medicine

## 2021-01-30 ENCOUNTER — Ambulatory Visit (INDEPENDENT_AMBULATORY_CARE_PROVIDER_SITE_OTHER): Payer: BC Managed Care – PPO | Admitting: Family Medicine

## 2021-01-30 VITALS — BP 145/92 | HR 80 | Temp 98.3°F | Ht 69.0 in | Wt 161.0 lb

## 2021-01-30 DIAGNOSIS — R079 Chest pain, unspecified: Secondary | ICD-10-CM

## 2021-01-30 DIAGNOSIS — Z23 Encounter for immunization: Secondary | ICD-10-CM | POA: Diagnosis not present

## 2021-01-30 DIAGNOSIS — Z0000 Encounter for general adult medical examination without abnormal findings: Secondary | ICD-10-CM

## 2021-01-30 DIAGNOSIS — D692 Other nonthrombocytopenic purpura: Secondary | ICD-10-CM

## 2021-01-30 DIAGNOSIS — Z1211 Encounter for screening for malignant neoplasm of colon: Secondary | ICD-10-CM

## 2021-01-30 DIAGNOSIS — K219 Gastro-esophageal reflux disease without esophagitis: Secondary | ICD-10-CM

## 2021-01-30 DIAGNOSIS — I714 Abdominal aortic aneurysm, without rupture, unspecified: Secondary | ICD-10-CM

## 2021-01-30 DIAGNOSIS — I129 Hypertensive chronic kidney disease with stage 1 through stage 4 chronic kidney disease, or unspecified chronic kidney disease: Secondary | ICD-10-CM | POA: Diagnosis not present

## 2021-01-30 LAB — URINALYSIS, ROUTINE W REFLEX MICROSCOPIC
Bilirubin, UA: NEGATIVE
Glucose, UA: NEGATIVE
Ketones, UA: NEGATIVE
Leukocytes,UA: NEGATIVE
Nitrite, UA: NEGATIVE
Specific Gravity, UA: 1.025 (ref 1.005–1.030)
Urobilinogen, Ur: 0.2 mg/dL (ref 0.2–1.0)
pH, UA: 5.5 (ref 5.0–7.5)

## 2021-01-30 LAB — MICROSCOPIC EXAMINATION
Bacteria, UA: NONE SEEN
WBC, UA: NONE SEEN /hpf (ref 0–5)

## 2021-01-30 LAB — MICROALBUMIN, URINE WAIVED
Creatinine, Urine Waived: 50 mg/dL (ref 10–300)
Microalb, Ur Waived: 150 mg/L — ABNORMAL HIGH (ref 0–19)
Microalb/Creat Ratio: >300 mg/g — ABNORMAL HIGH (ref <30)

## 2021-01-30 MED ORDER — LISINOPRIL 20 MG PO TABS: 20.0000 mg | ORAL_TABLET | Freq: Every day | ORAL | 1 refills | Status: AC

## 2021-01-30 MED ORDER — DEXLANSOPRAZOLE 60 MG PO CPDR: 60.0000 mg | DELAYED_RELEASE_CAPSULE | Freq: Every day | ORAL | 3 refills | Status: AC

## 2021-01-30 MED ORDER — NAPROXEN 500 MG PO TABS: 500.0000 mg | ORAL_TABLET | Freq: Two times a day (BID) | ORAL | 1 refills | Status: AC

## 2021-01-30 NOTE — Assessment & Plan Note (Signed)
Not under good control. Has failed protonix, aciphex, nexium, omeprazole. Done well with dexilant. Will check to see if it is now covered. Refill sent to the pharmacy.

## 2021-01-30 NOTE — Assessment & Plan Note (Signed)
Reassured patient. Continue to monitor.  

## 2021-01-30 NOTE — Assessment & Plan Note (Signed)
1 episode of tearing severe pain about 3 months ago. Last [place] of AAA was in April- will get one sooner to make sure it has not changed. Await results. Keep BP and cholesterol under good control.

## 2021-01-30 NOTE — Progress Notes (Signed)
BP (!) 145/92    Pulse 80    Temp 98.3 F (36.8 C)    Ht 5\' 9"  (1.753 m)    Wt 161 lb (73 kg)    SpO2 98%    BMI 23.78 kg/m    Subjective:    Patient ID: Daniel Mcneil, male    DOB: 01/10/1961, 61 y.o.   MRN: [id number]  HPI: Roben Schliep is a 61 y.o. male presenting on 01/30/2021 for comprehensive medical examination. Current medical complaints include:  CHEST PAIN Time since onset: 3 months Onset: sudden Quality: sharp, ripping pain Severity: severe Location: whole chest Radiation: none Episode duration: 10 minutes Frequency: 1x  Related to exertion: yes Activity when pain started: putting up drywall Trauma: no Anxiety/recent stressors: no Status: better Treatments attempted: nothing  Current pain status: pain free Shortness of breath: yes Cough: no Nausea: no Diaphoresis: no Heartburn: no Palpitations: no  HYPERTENSION- has been out of his lisinopril for about 3 days Hypertension status: uncontrolled  Satisfied with current treatment? yes Duration of hypertension: chronic BP monitoring frequency:  rarely BP medication side effects:  no Medication compliance: good compliance Previous BP meds:lisinopril Aspirin: no Recurrent headaches: no Visual changes: no Palpitations: no Dyspnea: no Chest pain: yes Lower extremity edema: no Dizzy/lightheaded: no  He currently lives with: wife Interim Problems from his last visit: no  Depression Screen done today and results listed below:  Depression screen [facility] 2/9 01/30/2021 01/30/2021 01/23/2020 11/20/2019 03/22/2019  Decreased Interest 0 - 0 0 0  Down, Depressed, Hopeless 0 0 0 0 0  PHQ - 2 Score 0 0 0 0 0  Altered sleeping - - - 1 0  Tired, decreased energy - - - 1 0  Change in appetite - - - 0 0  Feeling bad or failure about yourself  - - - 0 0  Trouble concentrating - - - 0 0  Moving slowly or fidgety/restless - - - 0 0  Suicidal thoughts - - - 0 0  PHQ-9 Score - - - 2 0  Difficult doing work/chores - - - Not  difficult at all Not difficult at all    Past Medical History:  Past Medical History:  Diagnosis Date   Chronic kidney disease, stage I    Dysphagia    GERD (gastroesophageal reflux disease)    Herniated disc, cervical    Hypertension    Hypertensive nephropathy    Lumbar herniated disc    Tobacco use disorder    Urinary hesitancy     Surgical History:  Past Surgical History:  Procedure Laterality Date   BIOPSY [name] 04/10/2016   Procedure: BIOPSY;  Surgeon: [name], MD;  Location: [facility];  Service: Endoscopy;  Laterality: [name];   COLONOSCOPY WITH PROPOFOL [name] 04/10/2016   Procedure: COLONOSCOPY WITH PROPOFOL;  Surgeon: [name], MD;  Location: [facility];  Service: Endoscopy;  Laterality: [name];  requests - not first   HERNIA REPAIR      Medications:  Current Outpatient Medications on File Prior to Visit  Medication Sig   Aspirin-Salicylamide-Caffeine (BC HEADACHE POWDER PO) Take by mouth as needed.   Multiple Vitamins-Minerals (EQ MULTIVITAMINS ADULT GUMMY PO) Take by mouth daily.   No current facility-administered medications on file prior to visit.    Allergies:  Allergies  Allergen Reactions   Flomax [Tamsulosin]     dizziness   Wellbutrin [Bupropion] Other (See Comments)    irritability    Social History:  Social History  Socioeconomic History   Marital status: Married    Spouse name: Not on file   Number of children: Not on file   Years of education: Not on file   Highest education level: Not on file  Occupational History   Not on file  Tobacco Use   Smoking status: Every Day    Packs/day: 1.00    Years: 36.00    Pack years: 36.00    Types: Cigarettes   Smokeless tobacco: Never   Tobacco comments:    since age 68.  Vaping Use   Vaping Use: Never used  Substance and Sexual Activity   Alcohol use: Yes    Alcohol/week: 20.0 standard drinks    Types: 20 Cans of beer per week    Comment:     Drug use: No   Sexual  activity: Yes    Birth control/protection: None  Other Topics Concern   Not on file  Social History Narrative   Not on file   Social Determinants of Health   Financial Resource Strain: Not on file  Food Insecurity: Not on file  Transportation Needs: Not on file  Physical Activity: Not on file  Stress: Not on file  Social Connections: Not on file  Intimate Partner Violence: Not on file   Social History   Tobacco Use  Smoking Status Every Day   Packs/day: 1.00   Years: 36.00   Pack years: 36.00   Types: Cigarettes  Smokeless Tobacco Never  Tobacco Comments   since age 77.   Social History   Substance and Sexual Activity  Alcohol Use Yes   Alcohol/week: 20.0 standard drinks   Types: 20 Cans of beer per week   Comment:      Family History:  Family History  Problem Relation Age of Onset   Stroke Mother    Hypertension Mother    Cancer Father    Hypertension Father    Diabetes Father    Heart disease Father    Cancer Maternal Grandfather    Hypertension Sister    Cancer Sister        Lung cancer    Past medical history, surgical history, medications, allergies, family history and social history reviewed with patient today and changes made to appropriate areas of the chart.   Review of Systems  Constitutional: Negative.   HENT:  Positive for hearing loss. Negative for congestion, ear discharge, ear pain, nosebleeds, sinus pain, sore throat and tinnitus.   Eyes: Negative.   Respiratory:  Positive for cough and wheezing. Negative for hemoptysis, sputum production, shortness of breath and stridor.   Cardiovascular:  Positive for chest pain and leg swelling. Negative for palpitations, orthopnea, claudication and PND.  Gastrointestinal: Negative.   Genitourinary: Negative.   Musculoskeletal:  Positive for neck pain. Negative for back pain, falls, joint pain and myalgias.  Skin: Negative.   Neurological: Negative.   Endo/Heme/Allergies:  Negative for environmental  allergies and polydipsia. Bruises/bleeds easily.  Psychiatric/Behavioral: Negative.    All other ROS negative except what is listed above and in the HPI.      Objective:    BP (!) 145/92    Pulse 80    Temp 98.3 F (36.8 C)    Ht 5\' 9"  (2.979 m)    Wt 161 lb (73 kg)    SpO2 98%    BMI 23.78 kg/m   Wt Readings from Last 3 Encounters:  01/30/21 161 lb (73 kg)  07/25/20 158 lb 3.2 oz (71.8 kg)  05/16/20 163 lb (73.9 kg)    Physical Exam Vitals and nursing note reviewed.  Constitutional:      General: He is not in acute distress.    Appearance: Normal appearance. He is normal weight. He is not ill-appearing, toxic-appearing or diaphoretic.  HENT:     Head: Normocephalic and atraumatic.     Right Ear: Tympanic membrane, ear canal and external ear normal. There is no impacted cerumen.     Left Ear: Tympanic membrane, ear canal and external ear normal. There is no impacted cerumen.     Nose: Nose normal. No congestion or rhinorrhea.     Mouth/Throat:     Mouth: Mucous membranes are moist.     Pharynx: Oropharynx is clear. No oropharyngeal exudate or posterior oropharyngeal erythema.  Eyes:     General: No scleral icterus.       Right eye: No discharge.        Left eye: No discharge.     Extraocular Movements: Extraocular movements intact.     Conjunctiva/sclera: Conjunctivae normal.     Pupils: Pupils are equal, round, and reactive to light.  Neck:     Vascular: No carotid bruit.  Cardiovascular:     Rate and Rhythm: Normal rate and regular rhythm.     Pulses: Normal pulses.     Heart sounds: No murmur heard.   No friction rub. No gallop.  Pulmonary:     Effort: Pulmonary effort is normal. No respiratory distress.     Breath sounds: Normal breath sounds. No stridor. No wheezing, rhonchi or rales.  Chest:     Chest wall: No tenderness.  Abdominal:     General: Abdomen is flat. Bowel sounds are normal. There is no distension.     Palpations: Abdomen is soft. There is no mass.      Tenderness: There is no abdominal tenderness. There is no right CVA tenderness, left CVA tenderness, guarding or rebound.     Hernia: No hernia is present.  Genitourinary:    Comments: Genital exam deferred with shared decision making Musculoskeletal:        General: No swelling, tenderness, deformity or signs of injury.     Cervical back: Normal range of motion and neck supple. No rigidity. No muscular tenderness.     Right lower leg: No edema.     Left lower leg: No edema.  Lymphadenopathy:     Cervical: No cervical adenopathy.  Skin:    General: Skin is warm and dry.     Capillary Refill: Capillary refill takes less than 2 seconds.     Coloration: Skin is not jaundiced or pale.     Findings: No bruising, erythema, lesion or rash.  Neurological:     General: No focal deficit present.     Mental Status: He is alert and oriented to person, place, and time.     Cranial Nerves: No cranial nerve deficit.     Sensory: No sensory deficit.     Motor: No weakness.     Coordination: Coordination normal.     Gait: Gait normal.     Deep Tendon Reflexes: Reflexes normal.  Psychiatric:        Mood and Affect: Mood normal.        Behavior: Behavior normal.        Thought Content: Thought content normal.        Judgment: Judgment normal.    Results for orders placed or performed in visit on 01/30/21  Microscopic Examination   Urine  Result Value Ref Range   WBC, UA None seen 0 - 5 /hpf   RBC 0-2 0 - 2 /hpf   Epithelial Cells (non renal) 0-10 0 - 10 /hpf   Mucus, UA Present (A) Not Estab.   Bacteria, UA None seen None seen/Few  Urinalysis, Routine w reflex microscopic  Result Value Ref Range   Specific Gravity, UA 1.025 1.005 - 1.030   pH, UA 5.5 5.0 - 7.5   Color, UA Yellow Yellow   Appearance Ur Clear Clear   Leukocytes,UA Negative Negative   Protein,UA 2+ (A) Negative/Trace   Glucose, UA Negative Negative   Ketones, UA Negative Negative   RBC, UA Trace (A) Negative    Bilirubin, UA Negative Negative   Urobilinogen, Ur 0.2 0.2 - 1.0 mg/dL   Nitrite, UA Negative Negative   Microscopic Examination See below:   Microalbumin, Urine Waived  Result Value Ref Range   Microalb, Ur Waived 150 (H) 0 - 19 mg/L   Creatinine, Urine Waived 50 10 - 300 mg/dL   Microalb/Creat Ratio >300 (H) <30 mg/g      Assessment & Plan:   Problem List Items Addressed This Visit       Cardiovascular and Mediastinum   Abdominal aortic aneurysm (AAA) without rupture    1 episode of tearing severe pain about 3 months ago. Last [place] of AAA was in April- will get one sooner to make sure it has not changed. Await results. Keep BP and cholesterol under good control.       Relevant Medications   lisinopril (ZESTRIL) 20 MG tablet   Other Relevant Orders   VAS [place] AAA DUPLEX   Ambulatory referral to Cardiology   Senile purpura ([place])    Reassured patient. Continue to monitor.       Relevant Medications   lisinopril (ZESTRIL) 20 MG tablet     Digestive   GERD (gastroesophageal reflux disease)    Not under good control. Has failed protonix, aciphex, nexium, omeprazole. Done well with dexilant. Will check to see if it is now covered. Refill sent to the pharmacy.      Relevant Medications   dexlansoprazole (DEXILANT) 60 MG capsule     Genitourinary   Benign hypertensive renal disease    Has been off his meds. BP high. Restart. Call with any concerns.       Relevant Orders   Microalbumin, Urine Waived (Completed)   Other Visit Diagnoses     Routine general medical examination at a health care facility    -  Primary   Vaccines up to date. Screening labs checked today. Colonoscopy due in March. Continue diet and exercise. Call with any concerns.   Relevant Orders   Comprehensive metabolic panel   CBC with Differential/Platelet   Lipid Panel w/o Chol/HDL Ratio   PSA   TSH   Urinalysis, Routine w reflex microscopic (Completed)   Microalbumin, Urine Waived (Completed)    Chest pain, unspecified type       1 episode 3 months ago. Will check on his AAA. Will get him into cardiology for eval given risk factors. Call with any concerns.    Relevant Orders   EKG 12-Lead (Completed)   VAS [place] AAA DUPLEX   Ambulatory referral to Cardiology   Need for influenza vaccination       Flu shot given today.   Relevant Orders   Flu Vaccine QUAD 6+ mos PF IM (Fluarix Quad PF) (Completed)  Screening for colon cancer       Referral to GI placed today.   Relevant Orders   Ambulatory referral to Gastroenterology        Discussed aspirin prophylaxis for myocardial infarction prevention and decision was it was not indicated  LABORATORY TESTING:  Health maintenance labs ordered today as discussed above.   The natural history of prostate cancer and ongoing controversy regarding screening and potential treatment outcomes of prostate cancer has been discussed with the patient. The meaning of a false positive PSA and a false negative PSA has been discussed. He indicates understanding of the limitations of this screening test and wishes to proceed with screening PSA testing.   IMMUNIZATIONS:   - Tdap: Tetanus vaccination status reviewed: last tetanus booster within 10 years. - Influenza: Administered today - Pneumovax: Up to date - Prevnar: Not applicable - COVID: Up to date - HPV: Not applicable  SCREENING: - Colonoscopy: Up to date  Discussed with patient purpose of the colonoscopy is to detect colon cancer at curable precancerous or early stages   PATIENT COUNSELING:    Sexuality: Discussed sexually transmitted diseases, partner selection, use of condoms, avoidance of unintended pregnancy  and contraceptive alternatives.   Advised to avoid cigarette smoking.  I discussed with the patient that most people either abstain from alcohol or drink within safe limits (<=14/week and <=4 drinks/occasion for males, <=7/weeks and <= 3 drinks/occasion for females) and that the  risk for alcohol disorders and other health effects rises proportionally with the number of drinks per week and how often a drinker exceeds daily limits.  Discussed cessation/primary prevention of drug use and availability of treatment for abuse.   Diet: Encouraged to adjust caloric intake to maintain  or achieve ideal body weight, to reduce intake of dietary saturated fat and total fat, to limit sodium intake by avoiding high sodium foods and not adding table salt, and to maintain adequate dietary potassium and calcium preferably from fresh fruits, vegetables, and low-fat dairy products.    stressed the importance of regular exercise  Injury prevention: Discussed safety belts, safety helmets, smoke detector, smoking near bedding or upholstery.   Dental health: Discussed importance of regular tooth brushing, flossing, and dental visits.   Follow up plan: NEXT PREVENTATIVE PHYSICAL DUE IN 1 YEAR. No follow-ups on file.

## 2021-01-30 NOTE — Assessment & Plan Note (Signed)
Has been off his meds. BP high. Restart. Call with any concerns.

## 2021-01-31 ENCOUNTER — Telehealth: Payer: Self-pay

## 2021-01-31 LAB — CBC WITH DIFFERENTIAL/PLATELET
Basophils Absolute: 0 10*3/uL (ref 0.0–0.2)
Basos: 0 %
EOS (ABSOLUTE): 0.2 10*3/uL (ref 0.0–0.4)
Eos: 2 %
Hematocrit: 49.3 % (ref 37.5–51.0)
Hemoglobin: 17.1 g/dL (ref 13.0–17.7)
Immature Grans (Abs): 0.1 10*3/uL (ref 0.0–0.1)
Immature Granulocytes: 1 %
Lymphocytes Absolute: 0.9 10*3/uL (ref 0.7–3.1)
Lymphs: 10 %
MCH: 31.9 pg (ref 26.6–33.0)
MCHC: 34.7 g/dL (ref 31.5–35.7)
MCV: 92 fL (ref 79–97)
Monocytes Absolute: 0.9 10*3/uL (ref 0.1–0.9)
Monocytes: 10 %
Neutrophils Absolute: 7.2 10*3/uL — ABNORMAL HIGH (ref 1.4–7.0)
Neutrophils: 77 %
Platelets: 198 10*3/uL (ref 150–450)
RBC: 5.36 x10E6/uL (ref 4.14–5.80)
RDW: 13.1 % (ref 11.6–15.4)
WBC: 9.3 10*3/uL (ref 3.4–10.8)

## 2021-01-31 LAB — COMPREHENSIVE METABOLIC PANEL
ALT: 55 IU/L — ABNORMAL HIGH (ref 0–44)
AST: 129 IU/L — ABNORMAL HIGH (ref 0–40)
Albumin/Globulin Ratio: 1.6 (ref 1.2–2.2)
Albumin: 4.2 g/dL (ref 3.8–4.9)
Alkaline Phosphatase: 170 IU/L — ABNORMAL HIGH (ref 44–121)
BUN/Creatinine Ratio: 17 (ref 9–20)
BUN: 15 mg/dL (ref 6–24)
Bilirubin Total: <0.2 mg/dL (ref 0.0–1.2)
CO2: 22 mmol/L (ref 20–29)
Calcium: 9.4 mg/dL (ref 8.7–10.2)
Chloride: 105 mmol/L (ref 96–106)
Creatinine, Ser: 0.9 mg/dL (ref 0.76–1.27)
Globulin, Total: 2.6 g/dL (ref 1.5–4.5)
Glucose: 120 mg/dL — ABNORMAL HIGH (ref 70–99)
Potassium: 4.1 mmol/L (ref 3.5–5.2)
Sodium: 141 mmol/L (ref 134–144)
Total Protein: 6.8 g/dL (ref 6.0–8.5)
eGFR: 99 mL/min/{1.73_m2} (ref >59)

## 2021-01-31 LAB — PSA: Prostate Specific Ag, Serum: 0.3 ng/mL (ref 0.0–4.0)

## 2021-01-31 LAB — LIPID PANEL W/O CHOL/HDL RATIO
Cholesterol, Total: 192 mg/dL (ref 100–199)
HDL: 38 mg/dL — ABNORMAL LOW (ref >39)
LDL Chol Calc (NIH): 137 mg/dL — ABNORMAL HIGH (ref 0–99)
Triglycerides: 95 mg/dL (ref 0–149)
VLDL Cholesterol Cal: 17 mg/dL (ref 5–40)

## 2021-01-31 LAB — TSH: TSH: 2.04 u[IU]/mL (ref 0.450–4.500)

## 2021-01-31 NOTE — Telephone Encounter (Signed)
Called patient no answer no way to leave a voicemail mailbox full

## 2021-02-03 ENCOUNTER — Other Ambulatory Visit (INDEPENDENT_AMBULATORY_CARE_PROVIDER_SITE_OTHER): Payer: BC Managed Care – PPO

## 2021-02-03 ENCOUNTER — Encounter: Payer: Self-pay | Admitting: Family Medicine

## 2021-02-03 ENCOUNTER — Ambulatory Visit (INDEPENDENT_AMBULATORY_CARE_PROVIDER_SITE_OTHER): Payer: BC Managed Care – PPO | Admitting: Nurse Practitioner

## 2021-02-05 NOTE — Progress Notes (Signed)
Interpreted by me on 01/30/21. NSR at 71bpm, No ST segment changes

## 2021-02-20 ENCOUNTER — Ambulatory Visit: Payer: Self-pay | Admitting: *Deleted

## 2021-02-20 NOTE — Telephone Encounter (Signed)
Reason for Disposition  Abdominal pain is a chronic symptom (recurrent or ongoing AND present > 4 weeks)  Answer Assessment - Initial Assessment Questions 1. LOCATION: "Where does it hurt?"      Pt c/o abd pain, swelling in both legs, constipation.   I'm having trouble eating.   I saw the dr.   2 days later I started getting sick.    I started having bloating in my abd and sharp abd pain.   I'm having mild abd pain today.   I'm to reduce alcohol consumption.  So I cut back but then I had 4 beers and the next morning I felt like I was going to die.  2. RADIATION: "Does the pain shoot anywhere else?" (e.g., chest, back)     My entire abd was hurting at it's worst.  I'm swollen so much. 3. ONSET: "When did the pain begin?" (Minutes, hours or days ago)      *No Answer* 4. SUDDEN: "Gradual or sudden onset?"     *No Answer* 5. PATTERN "Does the pain come and go, or is it constant?"    - If constant: "Is it getting better, staying the same, or worsening?"      (Note: Constant means the pain never goes away completely; most serious pain is constant and it progresses)     - If intermittent: "How long does it last?" "Do you have pain now?"     (Note: Intermittent means the pain goes away completely between bouts)     *No Answer* 6. SEVERITY: "How bad is the pain?"  (e.g., Scale 1-10; mild, moderate, or severe)    - MILD (1-3): doesn't interfere with normal activities, abdomen soft and not tender to touch     - MODERATE (4-7): interferes with normal activities or awakens from sleep, abdomen tender to touch     - SEVERE (8-10): excruciating pain, doubled over, unable to do any normal activities       *No Answer* 7. RECURRENT SYMPTOM: "Have you ever had this type of stomach pain before?" If Yes, ask: "When was the last time?" and "What happened that time?"      *No Answer* 8. CAUSE: "What do you think is causing the stomach pain?"     *No Answer* 9. RELIEVING/AGGRAVATING FACTORS: "What makes it  better or worse?" (e.g., movement, antacids, bowel movement)     *No Answer* 10. OTHER SYMPTOMS: "Do you have any other symptoms?" (e.g., back pain, diarrhea, fever, urination pain, vomiting)       *No Answer*  Protocols used: Abdominal Pain - Male-A-AH

## 2021-02-20 NOTE — Telephone Encounter (Signed)
°  Chief Complaint: abd pain, bloating, legs swollen, constipated.    Has liver problems Symptoms: abd pain, constipation, swollen abd.   He informed me he has liver problems and is not supposed to be drinking alcohol. Frequency: intermittently Pertinent Negatives: Patient denies vomiting Disposition: [] ED /[] Urgent Care (no appt availability in office) / [x] Appointment(In office/virtual)/ []  [facility]/ [] Home Care/ [] Refused Recommended Disposition /[] [facility]/ []  Follow-up with PCP Additional Notes: Made him an appt with Dr. Wynetta Emery with instructions to go to the ED if his pain got worse.   Pt agreeable to this plan.

## 2021-02-26 DEATH — deceased

## 2021-02-27 ENCOUNTER — Ambulatory Visit: Payer: BC Managed Care – PPO | Admitting: Family Medicine

## 2021-03-10 ENCOUNTER — Telehealth: Payer: Self-pay | Admitting: Family Medicine

## 2021-03-10 NOTE — Telephone Encounter (Signed)
Pts wife is calling to notify crissman that the pt passed away on 03-22-21

## 2021-05-15 ENCOUNTER — Ambulatory Visit (INDEPENDENT_AMBULATORY_CARE_PROVIDER_SITE_OTHER): Payer: BC Managed Care – PPO | Admitting: Vascular Surgery

## 2021-05-15 ENCOUNTER — Encounter (INDEPENDENT_AMBULATORY_CARE_PROVIDER_SITE_OTHER): Payer: BC Managed Care – PPO

## 2021-05-15 ENCOUNTER — Other Ambulatory Visit (INDEPENDENT_AMBULATORY_CARE_PROVIDER_SITE_OTHER): Payer: BC Managed Care – PPO

## 2021-07-30 ENCOUNTER — Ambulatory Visit: Payer: BC Managed Care – PPO | Admitting: Family Medicine

## 2021-12-22 IMAGING — DX DG SHOULDER 2+V*R*
4 series · 4 of 4 positions shown · non-contrast
Comparison: None.

CLINICAL DATA: Pain and numbness in rt shoulder, rt wrist, and rt
1st ,2nd,and possibly 3rd fingers, decreased grip strength in 1st
and 2nd fingers, no known injury old or new, did have elbow and
finger injuries when he played sports when he was young, no surgery

EXAM:
RIGHT SHOULDER - 2+ VIEW

[shoulder ap]
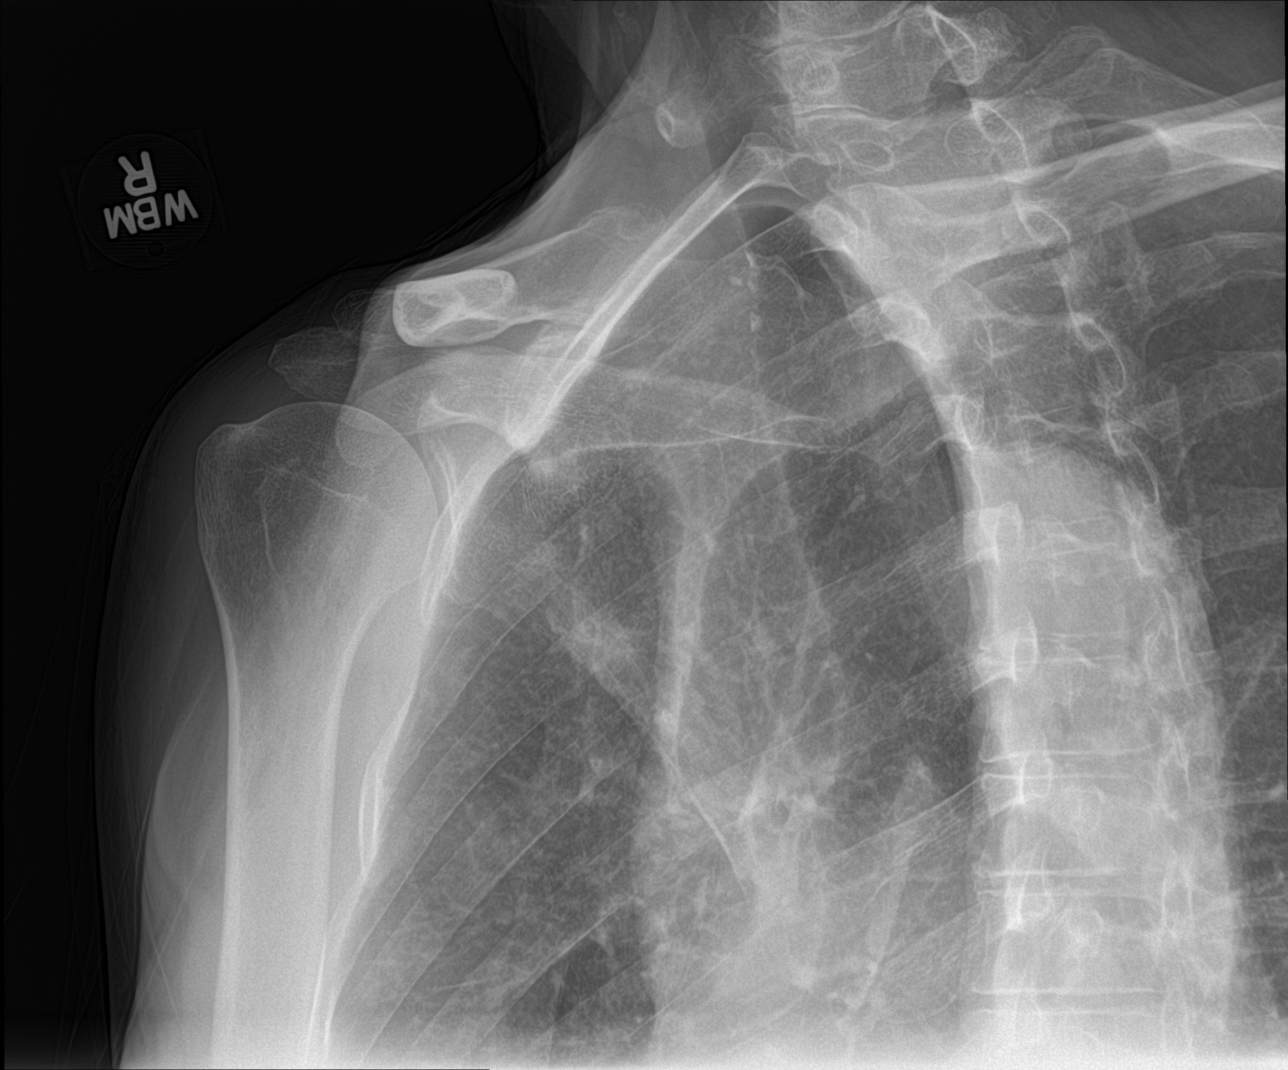

[shoulder y-view (1 of 2)]
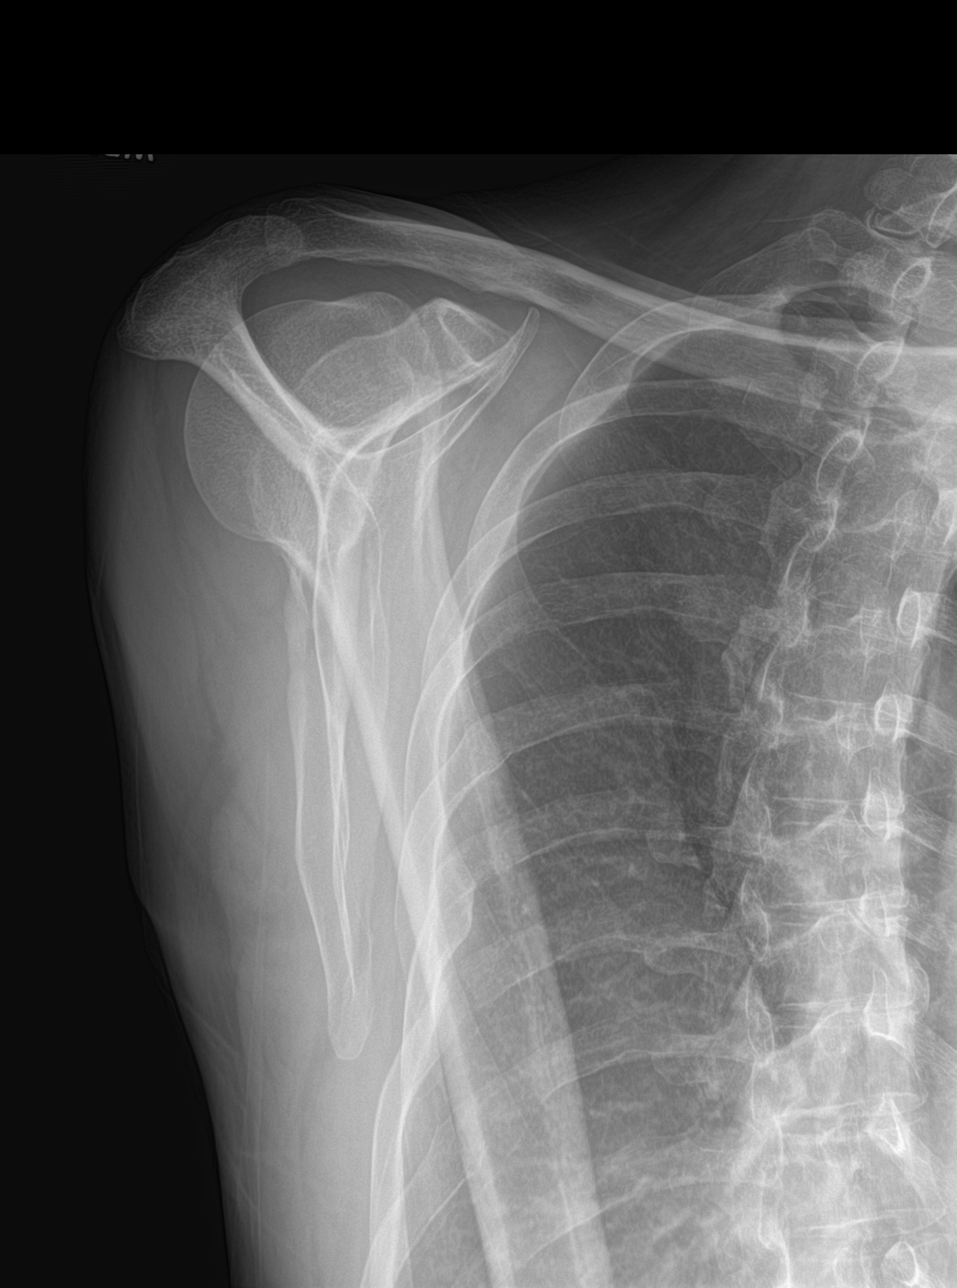

[shoulder axial]
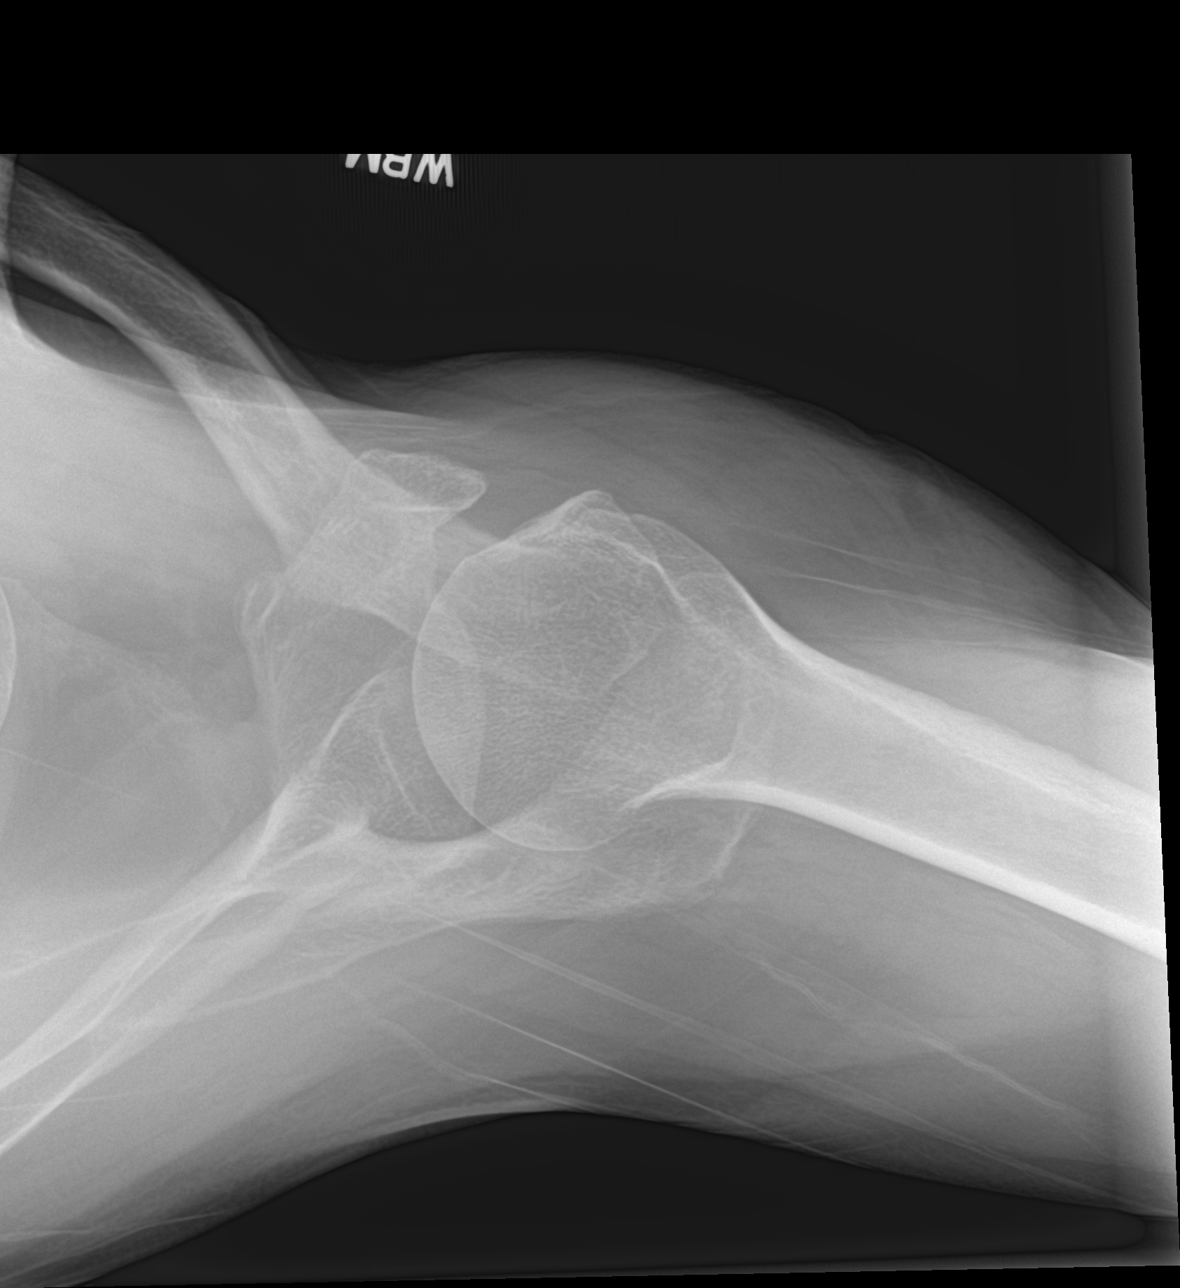

[shoulder y-view (2 of 2)]
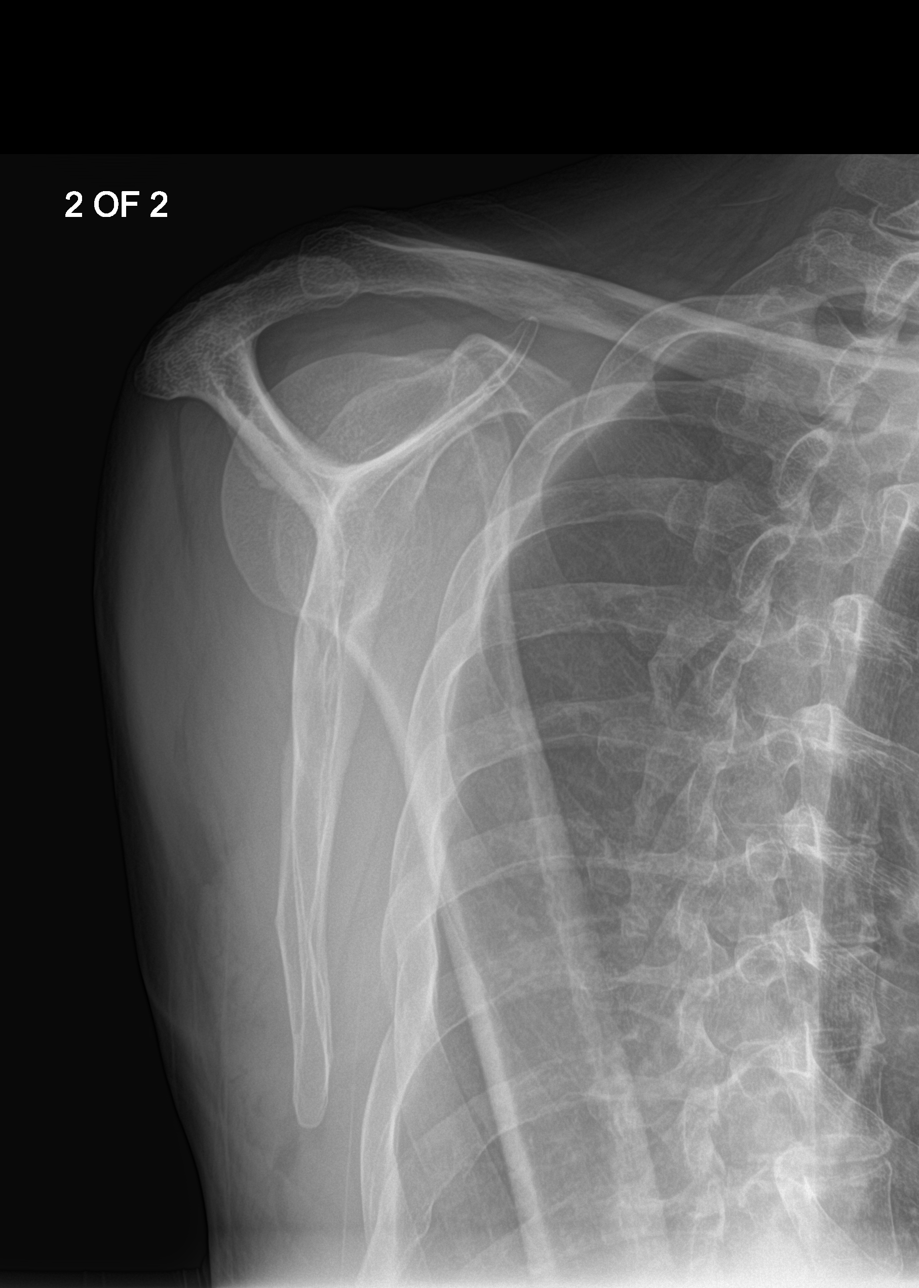

[4 of 4 positions shown; findings below may reference images not displayed]

FINDINGS: There is no evidence of fracture or dislocation. There is no
evidence of arthropathy or other focal bone abnormality. Soft
tissues are unremarkable. Multiple old right-sided rib fractures.
IMPRESSION: Negative right shoulder radiographs.
